# Patient Record
Sex: Female | Born: 1955
Health system: Southern US, Community
[De-identification: ages and names within clinical notes are randomized; demographics above are authoritative.]

## PROBLEM LIST (undated history)

## (undated) DIAGNOSIS — M1990 Unspecified osteoarthritis, unspecified site: Secondary | ICD-10-CM

## (undated) DIAGNOSIS — K219 Gastro-esophageal reflux disease without esophagitis: Secondary | ICD-10-CM

## (undated) DIAGNOSIS — H3530 Unspecified macular degeneration: Secondary | ICD-10-CM

## (undated) DIAGNOSIS — E785 Hyperlipidemia, unspecified: Secondary | ICD-10-CM

## (undated) DIAGNOSIS — L719 Rosacea, unspecified: Secondary | ICD-10-CM

## (undated) DIAGNOSIS — Z860100 Personal history of colon polyps, unspecified: Secondary | ICD-10-CM

## (undated) DIAGNOSIS — T7840XA Allergy, unspecified, initial encounter: Secondary | ICD-10-CM

## (undated) DIAGNOSIS — Z5189 Encounter for other specified aftercare: Secondary | ICD-10-CM

## (undated) HISTORY — PX: BREAST BIOPSY: SHX20

## (undated) HISTORY — PX: COSMETIC SURGERY: SHX468

## (undated) HISTORY — PX: BREAST SURGERY: SHX581

## (undated) HISTORY — PX: BREAST EXCISIONAL BIOPSY: SUR124

## (undated) HISTORY — DX: Unspecified osteoarthritis, unspecified site: M19.90

## (undated) HISTORY — DX: Personal history of colonic polyps: Z86.010

## (undated) HISTORY — DX: Personal history of colon polyps, unspecified: Z86.0100

## (undated) HISTORY — PX: AUGMENTATION MAMMAPLASTY: SUR837

## (undated) HISTORY — DX: Unspecified macular degeneration: H35.30

## (undated) HISTORY — PX: COLONOSCOPY: SHX174

## (undated) HISTORY — DX: Hyperlipidemia, unspecified: E78.5

## (undated) HISTORY — DX: Gastro-esophageal reflux disease without esophagitis: K21.9

## (undated) HISTORY — DX: Rosacea, unspecified: L71.9

## (undated) HISTORY — DX: Allergy, unspecified, initial encounter: T78.40XA

## (undated) HISTORY — DX: Encounter for other specified aftercare: Z51.89

## (undated) HISTORY — PX: TUBAL LIGATION: SHX77

---

## 1995-03-24 HISTORY — PX: ABDOMINAL HYSTERECTOMY: SHX81

## 2007-03-24 HISTORY — PX: AUGMENTATION MAMMAPLASTY: SUR837

## 2018-06-01 ENCOUNTER — Other Ambulatory Visit: Payer: Self-pay

## 2018-06-01 ENCOUNTER — Encounter: Payer: Self-pay | Admitting: Family Medicine

## 2018-06-01 ENCOUNTER — Ambulatory Visit (INDEPENDENT_AMBULATORY_CARE_PROVIDER_SITE_OTHER): Payer: 59 | Admitting: Family Medicine

## 2018-06-01 VITALS — BP 110/72 | HR 72 | Temp 98.1°F | Ht 64.5 in | Wt 132.1 lb

## 2018-06-01 DIAGNOSIS — Z1159 Encounter for screening for other viral diseases: Secondary | ICD-10-CM

## 2018-06-01 DIAGNOSIS — Z114 Encounter for screening for human immunodeficiency virus [HIV]: Secondary | ICD-10-CM

## 2018-06-01 DIAGNOSIS — Z23 Encounter for immunization: Secondary | ICD-10-CM | POA: Diagnosis not present

## 2018-06-01 DIAGNOSIS — L719 Rosacea, unspecified: Secondary | ICD-10-CM | POA: Diagnosis not present

## 2018-06-01 DIAGNOSIS — H353190 Nonexudative age-related macular degeneration, unspecified eye, stage unspecified: Secondary | ICD-10-CM

## 2018-06-01 DIAGNOSIS — H3530 Unspecified macular degeneration: Secondary | ICD-10-CM | POA: Insufficient documentation

## 2018-06-01 DIAGNOSIS — Z0000 Encounter for general adult medical examination without abnormal findings: Secondary | ICD-10-CM | POA: Diagnosis not present

## 2018-06-01 NOTE — Addendum Note (Signed)
Addended by: Kem Boroughs D on: 06/01/2018 10:57 AM   Modules accepted: Orders

## 2018-06-01 NOTE — Addendum Note (Signed)
Addended by: Elta Guadeloupe on: 06/01/2018 10:44 AM   Modules accepted: Orders

## 2018-06-01 NOTE — Progress Notes (Signed)
Chief Complaint  Patient presents with  . New Patient (Initial Visit)     Well Woman Sarah Flowers is here for a complete physical.   Her last physical was >1 year ago.  Current diet: in general, a "healthy" diet. Current exercise: yoga, running, walking.  Weight is stable and she denies daytime fatigue. No LMP recorded.  Seatbelt? Yes  Health Maintenance Pap/HPV- no cervix DEXA- 12.19 Mammogram- Yes Tetanus- No Hep C screening- No HIV screening- No  Past Medical History:  Diagnosis Date  . Macular degeneration   . Rosacea      Past Surgical History:  Procedure Laterality Date  . ABDOMINAL HYSTERECTOMY  1997  . BREAST SURGERY    . CESAREAN SECTION      Medications  Current Outpatient Medications on File Prior to Visit  Medication Sig Dispense Refill  . Estradiol (IMVEXXY MAINTENANCE PACK) 4 MCG INST Place vaginally.     Allergies Allergies  Allergen Reactions  . Sulfa Antibiotics Other (See Comments)    Mouth sores    Review of Systems: Constitutional:  no unexpected weight changes Eye:  +macular degeneration Ear/Nose/Mouth/Throat:  Ears:  no tinnitus or vertigo and no recent change in hearing Nose/Mouth/Throat:  no complaints of nasal congestion, no sore throat Cardiovascular: no chest pain Respiratory:  no cough and no shortness of breath Gastrointestinal:  no abdominal pain, no change in bowel habits GU:  Female: negative for dysuria or pelvic pain Musculoskeletal/Extremities:  no pain of the joints Integumentary (Skin/Breast): +roasacea; otherwise no abnormal skin lesions reported Neurologic:  no headaches Endocrine:  denies fatigue Hematologic/Lymphatic:  No areas of easy bleeding  Exam BP 110/72 (BP Location: Left Arm, Patient Position: Sitting, Cuff Size: Normal)   Pulse 72   Temp 98.1 F (36.7 C) (Oral)   Ht 5' 4.5" (1.638 m)   Wt 132 lb 2 oz (59.9 kg)   SpO2 98%   BMI 22.33 kg/m  General:  well developed, well nourished, in no  apparent distress Skin: small patch of telangiectasias on L cheek no significant moles, warts, or growths Head:  no masses, lesions, or tenderness Eyes:  pupils equal and round, sclera anicteric without injection Ears:  canals without lesions, TMs shiny without retraction, no obvious effusion, no erythema Nose:  nares patent, septum midline, mucosa normal, and no drainage or sinus tenderness Throat/Pharynx:  lips and gingiva without lesion; tongue and uvula midline; non-inflamed pharynx; no exudates or postnasal drainage Neck: neck supple without adenopathy, thyromegaly, or masses Lungs:  clear to auscultation, breath sounds equal bilaterally, no respiratory distress Cardio:  regular rate and rhythm, no bruits, no LE edema Abdomen:  abdomen soft, nontender; bowel sounds normal; no masses or organomegaly Genital: Defer to GYN Musculoskeletal:  symmetrical muscle groups noted without atrophy or deformity Extremities:  no clubbing, cyanosis, or edema, no deformities, no skin discoloration Neuro:  gait normal; deep tendon reflexes normal and symmetric Psych: well oriented with normal range of affect and appropriate judgment/insight  Assessment and Plan  Well adult exam - Plan: Comprehensive metabolic panel, Lipid panel  Screening for HIV (human immunodeficiency virus) - Plan: HIV Antibody (routine testing w rflx)  Encounter for hepatitis C screening test for low risk patient - Plan: Hepatitis C antibody  Rosacea - Plan: Ambulatory referral to Dermatology  Nonexudative age-related macular degeneration, unspecified laterality, unspecified stage   Well 63 y.o. female. Counseled on diet and exercise. Wt lifting rec'd. Doing well overall.  For mac degen, she is going ot find  a retinal specialist and let us know if she needs referral.  Lab orders for [facility] given.  Other orders as above. Follow up in 1 yr or prn, welcome to Medicare visit. The patient voiced understanding and agreement to the  plan.  [place], DO 06/01/18 10:13 AM

## 2018-06-01 NOTE — Addendum Note (Signed)
Addended by: Sharon Seller B on: 06/01/2018 10:23 AM   Modules accepted: Orders

## 2018-06-01 NOTE — Patient Instructions (Signed)
If you do not hear anything about your referral in the next 1-2 weeks, call our office and ask for an update.  Give [place] 2-3 business days to get the results of your labs back.   Keep the diet clean and stay active. Consider weight resistance exercising.   Let us know if you need anything.

## 2018-06-02 LAB — COMPREHENSIVE METABOLIC PANEL
AG Ratio: 2.4 (calc) (ref 1.0–2.5)
ALT: 19 U/L (ref 6–29)
AST: 23 U/L (ref 10–35)
Albumin: 4.5 g/dL (ref 3.6–5.1)
Alkaline phosphatase (APISO): 58 U/L (ref 37–153)
BUN: 10 mg/dL (ref 7–25)
CO2: 28 mmol/L (ref 20–32)
Calcium: 9.9 mg/dL (ref 8.6–10.4)
Chloride: 103 mmol/L (ref 98–110)
Creat: 0.84 mg/dL (ref 0.50–0.99)
Globulin: 1.9 g/dL (calc) (ref 1.9–3.7)
Glucose, Bld: 100 mg/dL (ref 65–139)
Potassium: 4.3 mmol/L (ref 3.5–5.3)
Sodium: 139 mmol/L (ref 135–146)
Total Bilirubin: 0.6 mg/dL (ref 0.2–1.2)
Total Protein: 6.4 g/dL (ref 6.1–8.1)

## 2018-06-02 LAB — HEPATITIS C ANTIBODY
Hepatitis C Ab: NONREACTIVE
SIGNAL TO CUT-OFF: 0.01 (ref <1.00)

## 2018-06-02 LAB — HIV ANTIBODY (ROUTINE TESTING W REFLEX): HIV 1&2 Ab, 4th Generation: NONREACTIVE

## 2018-06-02 LAB — LIPID PANEL
Cholesterol: 189 mg/dL (ref <200)
HDL: 79 mg/dL (ref >=50)
LDL CHOLESTEROL (CALC): 91 mg/dL
Non-HDL Cholesterol (Calc): 110 mg/dL (calc) (ref <130)
Total CHOL/HDL Ratio: 2.4 (calc) (ref <5.0)
Triglycerides: 98 mg/dL (ref <150)

## 2018-08-29 DIAGNOSIS — H353131 Nonexudative age-related macular degeneration, bilateral, early dry stage: Secondary | ICD-10-CM | POA: Diagnosis not present

## 2018-08-29 DIAGNOSIS — H2513 Age-related nuclear cataract, bilateral: Secondary | ICD-10-CM | POA: Diagnosis not present

## 2018-08-29 DIAGNOSIS — H43813 Vitreous degeneration, bilateral: Secondary | ICD-10-CM | POA: Diagnosis not present

## 2018-09-07 DIAGNOSIS — L719 Rosacea, unspecified: Secondary | ICD-10-CM | POA: Diagnosis not present

## 2018-12-08 DIAGNOSIS — Z23 Encounter for immunization: Secondary | ICD-10-CM | POA: Diagnosis not present

## 2018-12-08 DIAGNOSIS — L719 Rosacea, unspecified: Secondary | ICD-10-CM | POA: Diagnosis not present

## 2018-12-08 DIAGNOSIS — I781 Nevus, non-neoplastic: Secondary | ICD-10-CM | POA: Diagnosis not present

## 2019-01-24 ENCOUNTER — Other Ambulatory Visit (HOSPITAL_BASED_OUTPATIENT_CLINIC_OR_DEPARTMENT_OTHER): Payer: Self-pay | Admitting: Advanced Practice Midwife

## 2019-01-24 ENCOUNTER — Other Ambulatory Visit: Payer: Self-pay

## 2019-01-24 ENCOUNTER — Ambulatory Visit (INDEPENDENT_AMBULATORY_CARE_PROVIDER_SITE_OTHER): Payer: Medicare Other | Admitting: Advanced Practice Midwife

## 2019-01-24 ENCOUNTER — Encounter: Payer: Self-pay | Admitting: Advanced Practice Midwife

## 2019-01-24 ENCOUNTER — Other Ambulatory Visit (HOSPITAL_BASED_OUTPATIENT_CLINIC_OR_DEPARTMENT_OTHER): Payer: Self-pay | Admitting: Family Medicine

## 2019-01-24 VITALS — BP 134/82 | HR 79 | Ht 64.5 in | Wt 126.0 lb

## 2019-01-24 DIAGNOSIS — Z90710 Acquired absence of both cervix and uterus: Secondary | ICD-10-CM

## 2019-01-24 DIAGNOSIS — Z01419 Encounter for gynecological examination (general) (routine) without abnormal findings: Secondary | ICD-10-CM

## 2019-01-24 DIAGNOSIS — Z1231 Encounter for screening mammogram for malignant neoplasm of breast: Secondary | ICD-10-CM

## 2019-01-24 DIAGNOSIS — E8940 Asymptomatic postprocedural ovarian failure: Secondary | ICD-10-CM | POA: Insufficient documentation

## 2019-01-24 DIAGNOSIS — N952 Postmenopausal atrophic vaginitis: Secondary | ICD-10-CM | POA: Insufficient documentation

## 2019-01-24 DIAGNOSIS — N951 Menopausal and female climacteric states: Secondary | ICD-10-CM | POA: Insufficient documentation

## 2019-01-24 DIAGNOSIS — N898 Other specified noninflammatory disorders of vagina: Secondary | ICD-10-CM

## 2019-01-24 NOTE — Progress Notes (Signed)
Postmenopausal - hysterectomy done in 1997 but patient reports she does have her ovaries. Kathrene Alu RN

## 2019-01-24 NOTE — Patient Instructions (Signed)
Atrophic Vaginitis  Atrophic vaginitis is a condition in which the tissues that line the vagina become dry and thin. This condition is most common in women who have stopped having regular menstrual periods (are in menopause). This usually starts when a woman is 45-65 years old. That is the time when a woman's estrogen levels begin to drop (decrease). Estrogen is a female hormone. It helps to keep the tissues of the vagina moist. It stimulates the vagina to produce a clear fluid that lubricates the vagina for sexual intercourse. This fluid also protects the vagina from infection. Lack of estrogen can cause the lining of the vagina to get thinner and dryer. The vagina may also shrink in size. It may become less elastic. Atrophic vaginitis tends to get worse over time as a woman's estrogen level drops. What are the causes? This condition is caused by the normal drop in estrogen that happens around the time of menopause. What increases the risk? Certain conditions or situations may lower a woman's estrogen level, leading to a higher risk for atrophic vaginitis. You are more likely to develop this condition if:  You are taking medicines that block estrogen.  You have had your ovaries removed.  You are being treated for cancer with X-ray (radiation) or medicines (chemotherapy).  You have given birth or are breastfeeding.  You are older than age 50.  You smoke. What are the signs or symptoms? Symptoms of this condition include:  Pain, soreness, or bleeding during sexual intercourse (dyspareunia).  Vaginal burning, irritation, or itching.  Pain or bleeding when a speculum is used in a vaginal exam (pelvic exam).  Having burning pain when passing urine.  Vaginal discharge that is brown or yellow. In some cases, there are no symptoms. How is this diagnosed? This condition is diagnosed by taking a medical history and doing a physical exam. This will include a pelvic exam that checks the  vaginal tissues. Though rare, you may also have other tests, including:  A urine test.  A test that checks the acid balance in your vagina (acid balance test). How is this treated? Treatment for this condition depends on how severe your symptoms are. Treatment may include:  Using an over-the-counter vaginal lubricant before sex.  Using a long-acting vaginal moisturizer.  Using low-dose vaginal estrogen for moderate to severe symptoms that do not respond to other treatments. Options include creams, tablets, and inserts (vaginal rings). Before you use a vaginal estrogen, tell your health care provider if you have a history of: ? Breast cancer. ? Endometrial cancer. ? Blood clots. If you are not sexually active and your symptoms are very mild, you may not need treatment. Follow these instructions at home: Medicines  Take over-the-counter and prescription medicines only as told by your health care provider. Do not use herbal or alternative medicines unless your health care provider says that you can.  Use over-the-counter creams, lubricants, or moisturizers for dryness only as directed by your health care provider. General instructions  If your atrophic vaginitis is caused by menopause, discuss all of your menopause symptoms and treatment options with your health care provider.  Do not douche.  Do not use products that can make your vagina dry. These include: ? Scented feminine sprays. ? Scented tampons. ? Scented soaps.  Vaginal intercourse can help to improve blood flow and elasticity of vaginal tissue. If it hurts to have sex, try using a lubricant or moisturizer just before having intercourse. Contact a health care provider if:    Your discharge looks different than normal.  Your vagina has an unusual smell.  You have new symptoms.  Your symptoms do not improve with treatment.  Your symptoms get worse. Summary  Atrophic vaginitis is a condition in which the tissues that  line the vagina become dry and thin. It is most common in women who have stopped having regular menstrual periods (are in menopause).  Treatment options include using vaginal lubricants and low-dose vaginal estrogen.  Contact a health care provider if your vagina has an unusual smell, or if your symptoms get worse or do not improve after treatment. This information is not intended to replace advice given to you by your health care provider. Make sure you discuss any questions you have with your health care provider. Document Released: 07/24/2014 Document Revised: 02/19/2017 Document Reviewed: 12/03/2016 Elsevier Patient Education  2020 [employer] Maintenance After Age 85 After age 36, you are at a higher risk for certain long-term diseases and infections as well as injuries from falls. Falls are a major cause of broken bones and head injuries in people who are older than age 51. Getting regular preventive care can help to keep you healthy and well. Preventive care includes getting regular testing and making lifestyle changes as recommended by your health care provider. Talk with your health care provider about:  Which screenings and tests you should have. A screening is a test that checks for a disease when you have no symptoms.  A diet and exercise plan that is right for you. What should I know about screenings and tests to prevent falls? Screening and testing are the best ways to find a health problem early. Early diagnosis and treatment give you the best chance of managing medical conditions that are common after age 21. Certain conditions and lifestyle choices may make you more likely to have a fall. Your health care provider may recommend:  Regular vision checks. Poor vision and conditions such as cataracts can make you more likely to have a fall. If you wear glasses, make sure to get your prescription updated if your vision changes.  Medicine review. Work with your health care  provider to regularly review all of the medicines you are taking, including over-the-counter medicines. Ask your health care provider about any side effects that may make you more likely to have a fall. Tell your health care provider if any medicines that you take make you feel dizzy or sleepy.  Osteoporosis screening. Osteoporosis is a condition that causes the bones to get weaker. This can make the bones weak and cause them to break more easily.  Blood pressure screening. Blood pressure changes and medicines to control blood pressure can make you feel dizzy.  Strength and balance checks. Your health care provider may recommend certain tests to check your strength and balance while standing, walking, or changing positions.  Foot health exam. Foot pain and numbness, as well as not wearing proper footwear, can make you more likely to have a fall.  Depression screening. You may be more likely to have a fall if you have a fear of falling, feel emotionally low, or feel unable to do activities that you used to do.  Alcohol use screening. Using too much alcohol can affect your balance and may make you more likely to have a fall. What actions can I take to lower my risk of falls? General instructions  Talk with your health care provider about your risks for falling. Tell your health care provider if: ?  You fall. Be sure to tell your health care provider about all falls, even ones that seem minor. ? You feel dizzy, sleepy, or off-balance.  Take over-the-counter and prescription medicines only as told by your health care provider. These include any supplements.  Eat a healthy diet and maintain a healthy weight. A healthy diet includes low-fat dairy products, low-fat (lean) meats, and fiber from whole grains, beans, and lots of fruits and vegetables. Home safety  Remove any tripping hazards, such as rugs, cords, and clutter.  Install safety equipment such as grab bars in bathrooms and safety rails  on stairs.  Keep rooms and walkways well-lit. Activity   Follow a regular exercise program to stay fit. This will help you maintain your balance. Ask your health care provider what types of exercise are appropriate for you.  If you need a cane or walker, use it as recommended by your health care provider.  Wear supportive shoes that have nonskid soles. Lifestyle  Do not drink alcohol if your health care provider tells you not to drink.  If you drink alcohol, limit how much you have: ? 0-1 drink a day for women. ? 0-2 drinks a day for men.  Be aware of how much alcohol is in your drink. In the [place] one drink equals one typical bottle of beer (12 oz), one-half glass of wine (5 oz), or one shot of hard liquor (1 oz).  Do not use any products that contain nicotine or tobacco, such as cigarettes and e-cigarettes. If you need help quitting, ask your health care provider. Summary  Having a healthy lifestyle and getting preventive care can help to protect your health and wellness after age 37.  Screening and testing are the best way to find a health problem early and help you avoid having a fall. Early diagnosis and treatment give you the best chance for managing medical conditions that are more common for people who are older than age 51.  Falls are a major cause of broken bones and head injuries in people who are older than age 60. Take precautions to prevent a fall at home.  Work with your health care provider to learn what changes you can make to improve your health and wellness and to prevent falls. This information is not intended to replace advice given to you by your health care provider. Make sure you discuss any questions you have with your health care provider. Document Released: 01/20/2017 Document Revised: 06/30/2018 Document Reviewed: 01/20/2017 Elsevier Patient Education  2020 [employer].

## 2019-01-24 NOTE — Progress Notes (Signed)
GYNECOLOGY ANNUAL PREVENTATIVE CARE ENCOUNTER NOTE  Subjective:   Sarah Flowers is a 63 y.o. G23P2100 female here for a routine annual gynecologic exam.  Current complaints: Vaginal dryness despite estrogen cream use.   States was having good relief when she started the cream, but over time it does not work as well.  Did not like the Premarin cream due to inconvenience and messiness of it.   Not having any problems with hot flashes or other menopausal symptoms.   Denies abnormal vaginal bleeding, discharge, pelvic pain, problems with intercourse except vaginal irritation due to atrophy,  or other gynecologic concerns.   Has had annual physical exam with Dr Nani Ravens in March. Hx hysterectomy, still has ovaries.    Gynecologic History No LMP recorded. Patient is postmenopausal. Contraception: status post hysterectomy and menopausal Last Pap: can't remember Results were: normal Last mammogram: 2019. Results were: normal  Obstetric History OB History  Gravida Para Term Preterm AB Living  3 3 2 1       SAB TAB Ectopic Multiple Live Births          3    # Outcome Date GA Lbr Len/2nd Weight Sex Delivery Anes PTL Lv  3 Preterm  [redacted]w[redacted]d   F CS-LTranv        Complications: Abruptio Placenta  2 Term      Vag-Spont     1 Term      Vag-Spont       Past Medical History:  Diagnosis Date  . Macular degeneration   . Rosacea     Past Surgical History:  Procedure Laterality Date  . ABDOMINAL HYSTERECTOMY  1997  . BREAST SURGERY    . CESAREAN SECTION      Current Outpatient Medications on File Prior to Visit  Medication Sig Dispense Refill  . Bacillus Coagulans-Inulin (PROBIOTIC) 1-250 BILLION-MG CAPS Take by mouth.    . diphenhydrAMINE (BENADRYL) 25 MG tablet Take 25 mg by mouth every 6 (six) hours as needed.    . doxycycline (MONODOX) 100 MG capsule Take 100 mg by mouth 2 (two) times daily.    . Estradiol (IMVEXXY MAINTENANCE PACK) 4 MCG INST Place vaginally.    Marland Kitchen loratadine  (CLARITIN) 10 MG tablet Take 10 mg by mouth daily.    . Multiple Vitamins-Minerals (PRESERVISION AREDS 2+MULTI VIT PO) Take by mouth.     No current facility-administered medications on file prior to visit.     Allergies  Allergen Reactions  . Sulfa Antibiotics Other (See Comments)    Mouth sores    Social History   Socioeconomic History  . Marital status: Married    Spouse name: Not on file  . Number of children: Not on file  . Years of education: Not on file  . Highest education level: Not on file  Occupational History  . Not on file  Social Needs  . Financial resource strain: Not on file  . Food insecurity    Worry: Not on file    Inability: Not on file  . Transportation needs    Medical: Not on file    Non-medical: Not on file  Tobacco Use  . Smoking status: Never Smoker  . Smokeless tobacco: Never Used  Substance and Sexual Activity  . Alcohol use: Yes    Comment: occaional  . Drug use: Never  . Sexual activity: Yes  Lifestyle  . Physical activity    Days per week: Not on file    Minutes per session: Not on  file  . Stress: Not on file  Relationships  . Social Herbalist on phone: Not on file    Gets together: Not on file    Attends religious service: Not on file    Active member of club or organization: Not on file    Attends meetings of clubs or organizations: Not on file    Relationship status: Not on file  . Intimate partner violence    Fear of current or ex partner: Not on file    Emotionally abused: Not on file    Physically abused: Not on file    Forced sexual activity: Not on file  Other Topics Concern  . Not on file  Social History Narrative  . Not on file    Family History  Problem Relation Age of Onset  . Multiple sclerosis Mother   . Heart disease Father     The following portions of the patient's history were reviewed and updated as appropriate: allergies, current medications, past family history, past medical history,  past social history, past surgical history and problem list.  Review of Systems A comprehensive review of systems was negative except for: Genitourinary: positive for vaginal dryness and pain in vaginal tissue with intercourse No problems with diarrhea or constipation, no hot flashes   Objective:  BP 134/82   Pulse 79   Ht 5' 4.5" (1.638 m)   Wt 57.2 kg   BMI 21.29 kg/m  CONSTITUTIONAL: Well-developed, well-nourished female in no acute distress.  NECK: Normal range of motion, supple, no masses.  Normal thyroid.  SKIN: Skin is warm and dry. No rash noted. Not diaphoretic. No erythema. No pallor. NEUROLOGIC: Alert and oriented to person, place, and time. Normal reflexes, muscle tone coordination. No cranial nerve deficit noted. PSYCHIATRIC: Normal mood and affect. Normal behavior. Normal judgment and thought content. CARDIOVASCULAR: Normal heart rate noted, regular rhythm RESPIRATORY: Clear to auscultation bilaterally. Effort and breath sounds normal, no problems with respiration noted. BREASTS: Symmetric in size. No masses, skin changes, nipple drainage, or lymphadenopathy.   Exam consistent with implants bilaterally ABDOMEN: Soft, normal bowel sounds, no distention noted.  No tenderness, rebound or guarding.  PELVIC: External genitalia with atrophic changes to vulva and vagina, pink with loss of rugae.  There is a 1-19mm white, firm lesion at introitus at 7:00 position.  Appears like scar tissue but pt states has never noticed it.   No abnormal discharge noted.  Pap smear not obtained.  Uterus and cervix surgically absent.  no other palpable masses, No adnexal tenderness. MUSCULOSKELETAL: Normal range of motion. No tenderness.  No cyanosis, clubbing, or edema.     Assessment:  Annual gynecologic examination without pap smear Atrophic vaginal changes/dryness Vaginal/vulvar lesion   Plan:  Will consult Dr Ihor Dow re: options for treatment of atrophic vaginal irritation. Will  have her return next week for eval of vaginal/vulvar lesion, ?biopsy Mammogram scheduled Routine preventative health maintenance measures emphasized. Please refer to After Visit Summary for other counseling recommendations.

## 2019-02-01 ENCOUNTER — Other Ambulatory Visit: Payer: Self-pay

## 2019-02-01 ENCOUNTER — Ambulatory Visit (INDEPENDENT_AMBULATORY_CARE_PROVIDER_SITE_OTHER): Payer: Medicare Other | Admitting: Obstetrics & Gynecology

## 2019-02-01 ENCOUNTER — Encounter: Payer: Self-pay | Admitting: Obstetrics & Gynecology

## 2019-02-01 VITALS — BP 123/75 | HR 74 | Wt 126.0 lb

## 2019-02-01 DIAGNOSIS — N958 Other specified menopausal and perimenopausal disorders: Secondary | ICD-10-CM

## 2019-02-01 NOTE — Progress Notes (Signed)
History:  63 y.o. G3P2100 here today for Annual GYN exam. She denies problems. She is on bio identical hormones and wants to know whether she should cont this or change hormones. She cont to have hot flushes. She also reports vagina dryness.  She would also like to establish care.  She was seen by Hansel Feinstein who wanted her vulva examined to eval a possible lesion.    The following portions of the patient's history were reviewed and updated as appropriate: allergies, current medications, past family history, past medical history, past social history, past surgical history and problem list.  Review of Systems:  Pertinent items are noted in HPI.    Objective:  Physical Exam Blood pressure 123/75, pulse 74, weight 126 lb (57.2 kg).  CONSTITUTIONAL: Well-developed, well-nourished female in no acute distress.  HENT:  Normocephalic, atraumatic EYES: Conjunctivae and EOM are normal. No scleral icterus.  NECK: Normal range of motion SKIN: Skin is warm and dry. No rash noted. Not diaphoretic.No pallor. [place]: Alert and oriented to person, place, and time. Normal coordination.  Abd: Soft, nontender and nondistended Pelvic: Normal appearing external genitalia- some age related atrophy; normal appearing vaginal mucosa and cervix. No vulvar lesions noted. There is a healed scar from childbirth..  Normal discharge.  Small uterus, no other palpable masses, no uterine or adnexal tenderness   Assessment & Plan:  Genitourinary syndrome of menopause.   Coconut oil bid  Keep Imvexxy (topical EES) 2x week   Vulvar lesion  No lesion noted  F/u in 3 months or sooner prn  Total face-to-face time with patient was 20 min.  Greater than 50% was spent in counseling and coordination of care with the patient.   Ellison Rieth L. Harraway-Smith, M.D., Cherlynn June

## 2019-02-01 NOTE — Progress Notes (Signed)
Patient had well woman exam last with with CNM. Patient here for evaluation of PMP symptoms and atrophic vaginitis. Kathrene Alu RN

## 2019-02-01 NOTE — Patient Instructions (Signed)
Atrophic Vaginitis  Atrophic vaginitis is a condition in which the tissues that line the vagina become dry and thin. This condition is most common in women who have stopped having regular menstrual periods (are in menopause). This usually starts when a woman is 45-65 years old. That is the time when a woman's estrogen levels begin to drop (decrease). Estrogen is a female hormone. It helps to keep the tissues of the vagina moist. It stimulates the vagina to produce a clear fluid that lubricates the vagina for sexual intercourse. This fluid also protects the vagina from infection. Lack of estrogen can cause the lining of the vagina to get thinner and dryer. The vagina may also shrink in size. It may become less elastic. Atrophic vaginitis tends to get worse over time as a woman's estrogen level drops. What are the causes? This condition is caused by the normal drop in estrogen that happens around the time of menopause. What increases the risk? Certain conditions or situations may lower a woman's estrogen level, leading to a higher risk for atrophic vaginitis. You are more likely to develop this condition if:  You are taking medicines that block estrogen.  You have had your ovaries removed.  You are being treated for cancer with X-ray (radiation) or medicines (chemotherapy).  You have given birth or are breastfeeding.  You are older than age 50.  You smoke. What are the signs or symptoms? Symptoms of this condition include:  Pain, soreness, or bleeding during sexual intercourse (dyspareunia).  Vaginal burning, irritation, or itching.  Pain or bleeding when a speculum is used in a vaginal exam (pelvic exam).  Having burning pain when passing urine.  Vaginal discharge that is brown or yellow. In some cases, there are no symptoms. How is this diagnosed? This condition is diagnosed by taking a medical history and doing a physical exam. This will include a pelvic exam that checks the  vaginal tissues. Though rare, you may also have other tests, including:  A urine test.  A test that checks the acid balance in your vagina (acid balance test). How is this treated? Treatment for this condition depends on how severe your symptoms are. Treatment may include:  Using an over-the-counter vaginal lubricant before sex.  Using a long-acting vaginal moisturizer.  Using low-dose vaginal estrogen for moderate to severe symptoms that do not respond to other treatments. Options include creams, tablets, and inserts (vaginal rings). Before you use a vaginal estrogen, tell your health care provider if you have a history of: ? Breast cancer. ? Endometrial cancer. ? Blood clots. If you are not sexually active and your symptoms are very mild, you may not need treatment. Follow these instructions at home: Medicines  Take over-the-counter and prescription medicines only as told by your health care provider. Do not use herbal or alternative medicines unless your health care provider says that you can.  Use over-the-counter creams, lubricants, or moisturizers for dryness only as directed by your health care provider. General instructions  If your atrophic vaginitis is caused by menopause, discuss all of your menopause symptoms and treatment options with your health care provider.  Do not douche.  Do not use products that can make your vagina dry. These include: ? Scented feminine sprays. ? Scented tampons. ? Scented soaps.  Vaginal intercourse can help to improve blood flow and elasticity of vaginal tissue. If it hurts to have sex, try using a lubricant or moisturizer just before having intercourse. Contact a health care provider if:    Your discharge looks different than normal.  Your vagina has an unusual smell.  You have new symptoms.  Your symptoms do not improve with treatment.  Your symptoms get worse. Summary  Atrophic vaginitis is a condition in which the tissues that  line the vagina become dry and thin. It is most common in women who have stopped having regular menstrual periods (are in menopause).  Treatment options include using vaginal lubricants and low-dose vaginal estrogen.  Contact a health care provider if your vagina has an unusual smell, or if your symptoms get worse or do not improve after treatment. This information is not intended to replace advice given to you by your health care provider. Make sure you discuss any questions you have with your health care provider. Document Released: 07/24/2014 Document Revised: 02/19/2017 Document Reviewed: 12/03/2016 Elsevier Patient Education  2020 Elsevier Inc.  

## 2019-02-02 ENCOUNTER — Telehealth: Payer: Self-pay

## 2019-02-02 NOTE — Telephone Encounter (Signed)
Pt called the office requesting a Rx for Estadiol 10 mcg(Imvexxy Maintenance pack). Pt states she would like a 3 month supply. Pt made aware that message will be sent to provider. Nico Rogness l Areli Frary, CMA

## 2019-02-13 ENCOUNTER — Encounter: Payer: Self-pay | Admitting: Obstetrics & Gynecology

## 2019-02-13 MED ORDER — IMVEXXY MAINTENANCE PACK 4 MCG VA INST: 1.0000 | VAGINAL_INSERT | VAGINAL | 3 refills | Status: DC

## 2019-03-08 ENCOUNTER — Ambulatory Visit (HOSPITAL_BASED_OUTPATIENT_CLINIC_OR_DEPARTMENT_OTHER): Payer: Medicare Other

## 2019-03-13 ENCOUNTER — Encounter (HOSPITAL_BASED_OUTPATIENT_CLINIC_OR_DEPARTMENT_OTHER): Payer: Self-pay

## 2019-03-13 ENCOUNTER — Ambulatory Visit (HOSPITAL_BASED_OUTPATIENT_CLINIC_OR_DEPARTMENT_OTHER)
Admission: RE | Admit: 2019-03-13 | Discharge: 2019-03-13 | Disposition: A | Discharge status: Home / Self Care | Payer: Medicare Other | Source: Ambulatory Visit | Attending: Advanced Practice Midwife | Admitting: Advanced Practice Midwife

## 2019-03-13 ENCOUNTER — Other Ambulatory Visit: Payer: Self-pay

## 2019-03-13 DIAGNOSIS — Z1231 Encounter for screening mammogram for malignant neoplasm of breast: Secondary | ICD-10-CM | POA: Diagnosis not present

## 2019-04-11 ENCOUNTER — Other Ambulatory Visit: Payer: Medicare Other

## 2019-04-14 DIAGNOSIS — M9903 Segmental and somatic dysfunction of lumbar region: Secondary | ICD-10-CM | POA: Diagnosis not present

## 2019-04-14 DIAGNOSIS — M9901 Segmental and somatic dysfunction of cervical region: Secondary | ICD-10-CM | POA: Diagnosis not present

## 2019-04-14 DIAGNOSIS — M542 Cervicalgia: Secondary | ICD-10-CM | POA: Diagnosis not present

## 2019-04-14 DIAGNOSIS — M5442 Lumbago with sciatica, left side: Secondary | ICD-10-CM | POA: Diagnosis not present

## 2019-04-17 DIAGNOSIS — L570 Actinic keratosis: Secondary | ICD-10-CM | POA: Diagnosis not present

## 2019-04-21 DIAGNOSIS — M542 Cervicalgia: Secondary | ICD-10-CM | POA: Diagnosis not present

## 2019-04-21 DIAGNOSIS — M5442 Lumbago with sciatica, left side: Secondary | ICD-10-CM | POA: Diagnosis not present

## 2019-04-21 DIAGNOSIS — M9903 Segmental and somatic dysfunction of lumbar region: Secondary | ICD-10-CM | POA: Diagnosis not present

## 2019-04-21 DIAGNOSIS — M9901 Segmental and somatic dysfunction of cervical region: Secondary | ICD-10-CM | POA: Diagnosis not present

## 2019-04-22 ENCOUNTER — Ambulatory Visit: Payer: Medicare Other

## 2019-04-24 ENCOUNTER — Other Ambulatory Visit: Payer: Self-pay | Admitting: Obstetrics & Gynecology

## 2019-04-24 ENCOUNTER — Telehealth: Payer: Self-pay

## 2019-04-24 DIAGNOSIS — N952 Postmenopausal atrophic vaginitis: Secondary | ICD-10-CM

## 2019-04-24 MED ORDER — ESTRADIOL 0.1 MG/GM VA CREA: 1.0000 g | TOPICAL_CREAM | Freq: Every day | VAGINAL | 6 refills | Status: DC

## 2019-04-24 NOTE — Telephone Encounter (Signed)
-----   Message from Lavonia Drafts, MD sent at 04/24/2019  8:14 AM EST ----- Regarding: RE: Rx problem Please let pt know that I have sent a Rx for vaginal estridiol. She can use 1 gram nighly for 2 weeks followed by 3x/week.   Thx,  Clh-S  ----- Message ----- From: Valentina Lucks, CMA Sent: 04/17/2019   1:54 PM EST To: Lavonia Drafts, MD Subject: Rx problem                                     Imvexxy 4 mg Maintenance Pack is not covered by pt's insurance plan. Is there an alternative medication available?

## 2019-04-24 NOTE — Telephone Encounter (Signed)
Pt called the office back and states she is weaning herself off of the Imvexxy 4 mg and is not interested in using any vaginal creams. Pt states she will discuss with doctor at her appointment.  Catha Ontko l Mindy Gali, CMA

## 2019-04-24 NOTE — Telephone Encounter (Signed)
Called pt regarding medication.Left message for pt to call the office back. Shernita Rabinovich l Sandra Brents, CMA

## 2019-04-27 ENCOUNTER — Ambulatory Visit: Payer: Medicare Other

## 2019-04-27 ENCOUNTER — Ambulatory Visit: Payer: Medicare Other | Attending: Internal Medicine

## 2019-04-27 DIAGNOSIS — Z23 Encounter for immunization: Secondary | ICD-10-CM | POA: Insufficient documentation

## 2019-04-27 NOTE — Progress Notes (Signed)
   Covid-19 Vaccination Clinic  Name:  Sarah Flowers    MRN: [id number] DOB: November 12, 1955  04/27/2019  [name] was observed post Covid-19 immunization for 15 minutes without incidence. She was provided with Vaccine Information Sheet and instruction to access the V-Safe system.   [name] was instructed to call 911 with any severe reactions post vaccine: Marland Kitchen Difficulty breathing  . Swelling of your face and throat  . A fast heartbeat  . A bad rash all over your body  . Dizziness and weakness    Immunizations Administered    Name Date Dose VIS Date Route   Pfizer COVID-19 Vaccine 04/27/2019  9:11 AM 0.3 mL 03/03/2019 Intramuscular   Manufacturer: [place]   Lot: [id number]   [place]: [id number]

## 2019-04-28 DIAGNOSIS — M542 Cervicalgia: Secondary | ICD-10-CM | POA: Diagnosis not present

## 2019-04-28 DIAGNOSIS — M9903 Segmental and somatic dysfunction of lumbar region: Secondary | ICD-10-CM | POA: Diagnosis not present

## 2019-04-28 DIAGNOSIS — M5442 Lumbago with sciatica, left side: Secondary | ICD-10-CM | POA: Diagnosis not present

## 2019-04-28 DIAGNOSIS — M9901 Segmental and somatic dysfunction of cervical region: Secondary | ICD-10-CM | POA: Diagnosis not present

## 2019-05-10 ENCOUNTER — Ambulatory Visit (INDEPENDENT_AMBULATORY_CARE_PROVIDER_SITE_OTHER): Payer: Medicare Other | Admitting: Obstetrics & Gynecology

## 2019-05-10 ENCOUNTER — Other Ambulatory Visit: Payer: Self-pay

## 2019-05-10 ENCOUNTER — Encounter: Payer: Self-pay | Admitting: Obstetrics & Gynecology

## 2019-05-10 VITALS — BP 121/77 | HR 68 | Ht 64.5 in | Wt 131.1 lb

## 2019-05-10 DIAGNOSIS — N952 Postmenopausal atrophic vaginitis: Secondary | ICD-10-CM

## 2019-05-10 DIAGNOSIS — N9089 Other specified noninflammatory disorders of vulva and perineum: Secondary | ICD-10-CM

## 2019-05-10 NOTE — Patient Instructions (Signed)
Natural Vaginal moisturizer ingredients: Vit E Almond oil coconut oil Shea butter (or coco butter)  Blend together and apply a small amount to the vulva 1-2 times per day

## 2019-05-10 NOTE — Progress Notes (Signed)
History:  64 y.o. G3P2100 here today for f/u of possible vulvar lesion and vagianl atrophy. Pt denies changes to vulva. She decided against topical EES and want to cont natual ral ialutrula remedies. She has been using the coconut oil and has not noticed a difference or regression so she will cont to use this.  She thinks that the topical EES stopped being effective.    The following portions of the patient's history were reviewed and updated as appropriate: allergies, current medications, past family history, past medical history, past social history, past surgical history and problem list.  Review of Systems:  Pertinent items are noted in HPI.    Objective:  Physical Exam Blood pressure 121/77, pulse 68, height 5' 4.5" (1.638 m), weight 131 lb 1.3 oz (59.5 kg).  CONSTITUTIONAL: Well-developed, well-nourished female in no acute distress.  HENT:  Normocephalic, atraumatic EYES: Conjunctivae and EOM are normal. No scleral icterus.  NECK: Normal range of motion SKIN: Skin is warm and dry. No rash noted. Not diaphoretic.No pallor. [place]: Alert and oriented to person, place, and time. Normal coordination.  Pelvic: Normal appearing external genitalia- perv area appears to be a well healed scar from a previous episiotomy. There is no change from her previous visit.   Assessment & Plan:  Atrophic vaginitis- pt declined topical EES  Review natural options to consider in addition or in lieu of coconut oil  Natural Vaginal moisturizer ingredients:   Vit E   Almond oil   coconut oil   Shea butter (or coco butter)    Blend together and apply a small amount to the vulva 1-2 times per day   Vulvar lesion  Not concerning. Appears to be scar tissue.   Pt will f/u if any changes occur  Total face-to-face time with patient was 18 min.  Greater than 50% was spent in counseling and coordination of care with the patient.   Adler Alton L. Harraway-Smith, M.D., Cherlynn June

## 2019-05-12 DIAGNOSIS — M9901 Segmental and somatic dysfunction of cervical region: Secondary | ICD-10-CM | POA: Diagnosis not present

## 2019-05-12 DIAGNOSIS — M9903 Segmental and somatic dysfunction of lumbar region: Secondary | ICD-10-CM | POA: Diagnosis not present

## 2019-05-12 DIAGNOSIS — M5442 Lumbago with sciatica, left side: Secondary | ICD-10-CM | POA: Diagnosis not present

## 2019-05-12 DIAGNOSIS — M542 Cervicalgia: Secondary | ICD-10-CM | POA: Diagnosis not present

## 2019-05-22 ENCOUNTER — Ambulatory Visit: Payer: Medicare Other | Attending: Internal Medicine

## 2019-05-22 DIAGNOSIS — Z23 Encounter for immunization: Secondary | ICD-10-CM

## 2019-05-22 NOTE — Progress Notes (Signed)
   Covid-19 Vaccination Clinic  Name:  Sarah Flowers    MRN: [id number] DOB: 08-Nov-1955  05/22/2019  [name] was observed post Covid-19 immunization for 15 minutes without incidence. She was provided with Vaccine Information Sheet and instruction to access the V-Safe system.   [name] was instructed to call 911 with any severe reactions post vaccine: Marland Kitchen Difficulty breathing  . Swelling of your face and throat  . A fast heartbeat  . A bad rash all over your body  . Dizziness and weakness    Immunizations Administered    Name Date Dose VIS Date Route   Pfizer COVID-19 Vaccine 05/22/2019  9:40 AM 0.3 mL 03/03/2019 Intramuscular   Manufacturer: [place]   Lot: [id number]   [place]: [id number]

## 2019-05-26 DIAGNOSIS — M9903 Segmental and somatic dysfunction of lumbar region: Secondary | ICD-10-CM | POA: Diagnosis not present

## 2019-05-26 DIAGNOSIS — M5442 Lumbago with sciatica, left side: Secondary | ICD-10-CM | POA: Diagnosis not present

## 2019-05-26 DIAGNOSIS — M9901 Segmental and somatic dysfunction of cervical region: Secondary | ICD-10-CM | POA: Diagnosis not present

## 2019-05-26 DIAGNOSIS — M542 Cervicalgia: Secondary | ICD-10-CM | POA: Diagnosis not present

## 2019-06-07 ENCOUNTER — Ambulatory Visit: Payer: Medicare Other | Admitting: Family Medicine

## 2019-08-23 ENCOUNTER — Ambulatory Visit: Payer: 59 | Admitting: Family Medicine

## 2019-08-31 DIAGNOSIS — H43813 Vitreous degeneration, bilateral: Secondary | ICD-10-CM | POA: Diagnosis not present

## 2019-08-31 DIAGNOSIS — H353131 Nonexudative age-related macular degeneration, bilateral, early dry stage: Secondary | ICD-10-CM | POA: Diagnosis not present

## 2019-08-31 DIAGNOSIS — H2513 Age-related nuclear cataract, bilateral: Secondary | ICD-10-CM | POA: Diagnosis not present

## 2019-09-11 ENCOUNTER — Encounter: Payer: Self-pay | Admitting: Family Medicine

## 2019-09-11 ENCOUNTER — Ambulatory Visit (INDEPENDENT_AMBULATORY_CARE_PROVIDER_SITE_OTHER): Payer: Medicare Other | Admitting: Family Medicine

## 2019-09-11 ENCOUNTER — Other Ambulatory Visit: Payer: Self-pay

## 2019-09-11 VITALS — BP 110/78 | HR 74 | Temp 98.7°F | Resp 12 | Ht 64.5 in | Wt 126.8 lb

## 2019-09-11 DIAGNOSIS — Z0000 Encounter for general adult medical examination without abnormal findings: Secondary | ICD-10-CM

## 2019-09-11 DIAGNOSIS — Z136 Encounter for screening for cardiovascular disorders: Secondary | ICD-10-CM | POA: Diagnosis not present

## 2019-09-11 DIAGNOSIS — E2839 Other primary ovarian failure: Secondary | ICD-10-CM

## 2019-09-11 LAB — LIPID PANEL
Cholesterol: 228 mg/dL — ABNORMAL HIGH (ref 0–200)
HDL: 99.1 mg/dL (ref >39.00)
LDL Cholesterol: 113 mg/dL — ABNORMAL HIGH (ref 0–99)
NonHDL: 128.43
Total CHOL/HDL Ratio: 2
Triglycerides: 79 mg/dL (ref 0.0–149.0)
VLDL: 15.8 mg/dL (ref 0.0–40.0)

## 2019-09-11 LAB — COMPREHENSIVE METABOLIC PANEL
ALT: 27 U/L (ref 0–35)
AST: 32 U/L (ref 0–37)
Albumin: 4.8 g/dL (ref 3.5–5.2)
Alkaline Phosphatase: 67 U/L (ref 39–117)
BUN: 17 mg/dL (ref 6–23)
CO2: 27 mEq/L (ref 19–32)
Calcium: 10.3 mg/dL (ref 8.4–10.5)
Chloride: 102 mEq/L (ref 96–112)
Creatinine, Ser: 0.92 mg/dL (ref 0.40–1.20)
GFR: 61.08 mL/min (ref >60.00)
Glucose, Bld: 90 mg/dL (ref 70–99)
Potassium: 4.2 mEq/L (ref 3.5–5.1)
Sodium: 137 mEq/L (ref 135–145)
Total Bilirubin: 0.6 mg/dL (ref 0.2–1.2)
Total Protein: 6.9 g/dL (ref 6.0–8.3)

## 2019-09-11 LAB — HEMOGLOBIN A1C: Hgb A1c MFr Bld: 5.6 % (ref 4.6–6.5)

## 2019-09-11 NOTE — Patient Instructions (Signed)
Give [place] 2-3 business days to get the results of your labs back.   Send me a message around 1 week before you get back regarding your R hand and we will set you up with occupational therapy.  Your PCV23 (pneumococcal vaccine) would be due 8/26 of this year.   Keep the diet clean and stay active.  Let us know if you need anything.

## 2019-09-11 NOTE — Progress Notes (Signed)
Ts

## 2019-09-11 NOTE — Progress Notes (Signed)
Chief Complaint  Patient presents with  . Medicare Wellness    Subjective: Pt here for initial Welcome to Medicare Evaluation.  Vision Screen:   Hearing Screening   125Hz  250Hz  500Hz  1000Hz  2000Hz  3000Hz  4000Hz  6000Hz  8000Hz   Right ear:   Pass Pass Pass  Pass    Left ear:   Pass Pass Pass  Pass      Visual Acuity Screening   Right eye Left eye Both eyes  Without correction:     With correction: 20/20 20/20 20/20     Past Medical History:  Diagnosis Date  . Macular degeneration   . Rosacea    Family History  Problem Relation Age of Onset  . Multiple sclerosis Mother   . Heart disease Father    Past Surgical History:  Procedure Laterality Date  . ABDOMINAL HYSTERECTOMY  1997  . AUGMENTATION MAMMAPLASTY    . BREAST BIOPSY Bilateral    multiple, all benign   . BREAST SURGERY    . CESAREAN SECTION     Current Outpatient Medications on File Prior to Visit  Medication Sig Dispense Refill  . Bacillus Coagulans-Inulin (PROBIOTIC) 1-250 BILLION-MG CAPS Take by mouth daily.     . diphenhydrAMINE (BENADRYL) 25 MG tablet Take 25 mg by mouth every 6 (six) hours as needed.    . loratadine (CLARITIN) 10 MG tablet Take 10 mg by mouth daily.    . Multiple Vitamins-Minerals (PRESERVISION AREDS 2+MULTI VIT PO) Take by mouth.    . vitamin E (VITAMIN E) 200 UNIT capsule Take 200 Units by mouth daily.     Allergies  Allergen Reactions  . Sulfa Antibiotics Other (See Comments)    Mouth sores    Females: Smoking, alcohol/drugs, sunscreen  Mental Health/Substance abuse evaluation: Yes    PHQ-2:  Feelings of depression? No  Loss of satisfaction/pleasure in doing things? No   Fall Risk: Less than 2 falls within the past 12 months? Yes  Mindful of grabbing bars in bathroom, ruffles in rugs, poorly lit areas, handrails on the stairs? Yes   Discussion of functional ability done: Encouraged to maintain physical activity and flexibility. Live alone? No  Need help with the  following? Bathing: No  Managing money: No  Taking medications: No  Telephone use: No  Transportation: No  Shopping: No  Preparing meals: No   Hearing/Vision screen: Trouble hearing TV or radio when others do not? No  Straining or struggling to hear/understand conversation? No   Fire safety: Have a working smoke alarm? Yes   Diet Balanced diet? Yes  Assistance needed? No   BP 110/78 (BP Location: Right Arm, Cuff Size: Normal)   Pulse 74   Temp 98.7 F (37.1 C) (Temporal)   Resp 12   Ht 5' 4.5" (1.638 m)   Wt 126 lb 12.8 oz (57.5 kg)   SpO2 96%   BMI 21.43 kg/m   End of life care planning/counseling: Does patient wish to discuss end of life care/planning? Yes  Does the patient have an advanced directive? No   Patient code status/living will: Full code Forms given? Yes   Assessment and Plan Welcome to Medicare preventive visit  Colonoscopy/Mammogram/Pelvic exam- Written plan in AVS for preventative health services. Immunizations UTD Hand pain/deviation, will have her send message at end of summer when she returns to area and we can set up OT. Unilateral, doubt inflammatory arthropathy, she is not having pain either.  F/u in 1 year or prn. The patient voiced understanding and agreement to the  plan.  Shelda Pal, DO 09/11/19 12:58 PM

## 2019-09-15 ENCOUNTER — Other Ambulatory Visit: Payer: Self-pay

## 2019-09-15 ENCOUNTER — Ambulatory Visit (HOSPITAL_BASED_OUTPATIENT_CLINIC_OR_DEPARTMENT_OTHER)
Admission: RE | Admit: 2019-09-15 | Discharge: 2019-09-15 | Disposition: A | Discharge status: Home / Self Care | Payer: Medicare Other | Source: Ambulatory Visit | Attending: Family Medicine | Admitting: Family Medicine

## 2019-09-15 DIAGNOSIS — E2839 Other primary ovarian failure: Secondary | ICD-10-CM | POA: Diagnosis present

## 2019-09-15 DIAGNOSIS — M8588 Other specified disorders of bone density and structure, other site: Secondary | ICD-10-CM | POA: Diagnosis not present

## 2019-09-15 DIAGNOSIS — Z136 Encounter for screening for cardiovascular disorders: Secondary | ICD-10-CM

## 2019-09-15 DIAGNOSIS — Z13820 Encounter for screening for osteoporosis: Secondary | ICD-10-CM | POA: Diagnosis not present

## 2019-09-15 DIAGNOSIS — M8589 Other specified disorders of bone density and structure, multiple sites: Secondary | ICD-10-CM | POA: Diagnosis not present

## 2019-11-09 ENCOUNTER — Telehealth: Payer: Self-pay | Admitting: Family Medicine

## 2019-11-09 NOTE — Telephone Encounter (Signed)
Caller: Joanmarie Call back #  [phone]  Patient states she took a covid home test an is positive. She is wondering what she need to do next?

## 2019-11-10 NOTE — Telephone Encounter (Signed)
Notified patient but she stated she is out of town and it was a positive covid antigen test and she has come cold symptoms but no fever or chills.

## 2019-11-10 NOTE — Telephone Encounter (Signed)
Quarantine for the next 10 days, OK to return to public if fever free for 24 hrs and improving/no symptoms. Push fluids, look out for worsening symptoms and if so, seek care. Ty.

## 2019-11-10 NOTE — Telephone Encounter (Signed)
Patient called checking the status of mychart to provider in regards to [place] 19+.  Patient would like to know what to do.

## 2019-12-14 DIAGNOSIS — M546 Pain in thoracic spine: Secondary | ICD-10-CM | POA: Diagnosis not present

## 2019-12-14 DIAGNOSIS — M5413 Radiculopathy, cervicothoracic region: Secondary | ICD-10-CM | POA: Diagnosis not present

## 2019-12-14 DIAGNOSIS — M62830 Muscle spasm of back: Secondary | ICD-10-CM | POA: Diagnosis not present

## 2019-12-14 DIAGNOSIS — M9901 Segmental and somatic dysfunction of cervical region: Secondary | ICD-10-CM | POA: Diagnosis not present

## 2019-12-14 DIAGNOSIS — M9902 Segmental and somatic dysfunction of thoracic region: Secondary | ICD-10-CM | POA: Diagnosis not present

## 2019-12-20 DIAGNOSIS — M9901 Segmental and somatic dysfunction of cervical region: Secondary | ICD-10-CM | POA: Diagnosis not present

## 2019-12-20 DIAGNOSIS — M62830 Muscle spasm of back: Secondary | ICD-10-CM | POA: Diagnosis not present

## 2019-12-20 DIAGNOSIS — M5413 Radiculopathy, cervicothoracic region: Secondary | ICD-10-CM | POA: Diagnosis not present

## 2019-12-20 DIAGNOSIS — M9902 Segmental and somatic dysfunction of thoracic region: Secondary | ICD-10-CM | POA: Diagnosis not present

## 2019-12-20 DIAGNOSIS — M546 Pain in thoracic spine: Secondary | ICD-10-CM | POA: Diagnosis not present

## 2019-12-22 DIAGNOSIS — M9901 Segmental and somatic dysfunction of cervical region: Secondary | ICD-10-CM | POA: Diagnosis not present

## 2019-12-22 DIAGNOSIS — M9902 Segmental and somatic dysfunction of thoracic region: Secondary | ICD-10-CM | POA: Diagnosis not present

## 2019-12-22 DIAGNOSIS — M62830 Muscle spasm of back: Secondary | ICD-10-CM | POA: Diagnosis not present

## 2019-12-22 DIAGNOSIS — M5413 Radiculopathy, cervicothoracic region: Secondary | ICD-10-CM | POA: Diagnosis not present

## 2019-12-22 DIAGNOSIS — M546 Pain in thoracic spine: Secondary | ICD-10-CM | POA: Diagnosis not present

## 2020-01-12 DIAGNOSIS — M5413 Radiculopathy, cervicothoracic region: Secondary | ICD-10-CM | POA: Diagnosis not present

## 2020-01-12 DIAGNOSIS — M9902 Segmental and somatic dysfunction of thoracic region: Secondary | ICD-10-CM | POA: Diagnosis not present

## 2020-01-12 DIAGNOSIS — M9901 Segmental and somatic dysfunction of cervical region: Secondary | ICD-10-CM | POA: Diagnosis not present

## 2020-01-12 DIAGNOSIS — M62830 Muscle spasm of back: Secondary | ICD-10-CM | POA: Diagnosis not present

## 2020-01-12 DIAGNOSIS — M546 Pain in thoracic spine: Secondary | ICD-10-CM | POA: Diagnosis not present

## 2020-01-23 DIAGNOSIS — M9901 Segmental and somatic dysfunction of cervical region: Secondary | ICD-10-CM | POA: Diagnosis not present

## 2020-01-23 DIAGNOSIS — M546 Pain in thoracic spine: Secondary | ICD-10-CM | POA: Diagnosis not present

## 2020-01-23 DIAGNOSIS — M9902 Segmental and somatic dysfunction of thoracic region: Secondary | ICD-10-CM | POA: Diagnosis not present

## 2020-01-23 DIAGNOSIS — M5413 Radiculopathy, cervicothoracic region: Secondary | ICD-10-CM | POA: Diagnosis not present

## 2020-01-23 DIAGNOSIS — M62830 Muscle spasm of back: Secondary | ICD-10-CM | POA: Diagnosis not present

## 2020-02-20 DIAGNOSIS — H02814 Retained foreign body in left upper eyelid: Secondary | ICD-10-CM | POA: Diagnosis not present

## 2020-02-21 DEATH — deceased

## 2020-03-08 DIAGNOSIS — M5413 Radiculopathy, cervicothoracic region: Secondary | ICD-10-CM | POA: Diagnosis not present

## 2020-03-08 DIAGNOSIS — M9902 Segmental and somatic dysfunction of thoracic region: Secondary | ICD-10-CM | POA: Diagnosis not present

## 2020-03-08 DIAGNOSIS — M546 Pain in thoracic spine: Secondary | ICD-10-CM | POA: Diagnosis not present

## 2020-03-08 DIAGNOSIS — M62830 Muscle spasm of back: Secondary | ICD-10-CM | POA: Diagnosis not present

## 2020-03-08 DIAGNOSIS — M9901 Segmental and somatic dysfunction of cervical region: Secondary | ICD-10-CM | POA: Diagnosis not present

## 2020-04-03 DIAGNOSIS — M25511 Pain in right shoulder: Secondary | ICD-10-CM | POA: Diagnosis not present

## 2020-04-12 ENCOUNTER — Other Ambulatory Visit (HOSPITAL_BASED_OUTPATIENT_CLINIC_OR_DEPARTMENT_OTHER): Payer: Self-pay | Admitting: Family Medicine

## 2020-04-12 DIAGNOSIS — Z1231 Encounter for screening mammogram for malignant neoplasm of breast: Secondary | ICD-10-CM

## 2020-04-15 ENCOUNTER — Other Ambulatory Visit: Payer: Self-pay

## 2020-04-15 ENCOUNTER — Encounter (HOSPITAL_BASED_OUTPATIENT_CLINIC_OR_DEPARTMENT_OTHER): Payer: Self-pay

## 2020-04-15 ENCOUNTER — Ambulatory Visit (HOSPITAL_BASED_OUTPATIENT_CLINIC_OR_DEPARTMENT_OTHER)
Admission: RE | Admit: 2020-04-15 | Discharge: 2020-04-15 | Disposition: A | Discharge status: Home / Self Care | Payer: Medicare HMO | Source: Ambulatory Visit | Attending: Family Medicine | Admitting: Family Medicine

## 2020-04-15 DIAGNOSIS — Z1231 Encounter for screening mammogram for malignant neoplasm of breast: Secondary | ICD-10-CM | POA: Insufficient documentation

## 2020-04-24 DIAGNOSIS — M205X2 Other deformities of toe(s) (acquired), left foot: Secondary | ICD-10-CM | POA: Diagnosis not present

## 2020-04-24 DIAGNOSIS — M2042 Other hammer toe(s) (acquired), left foot: Secondary | ICD-10-CM | POA: Diagnosis not present

## 2020-04-24 DIAGNOSIS — S92911A Unspecified fracture of right toe(s), initial encounter for closed fracture: Secondary | ICD-10-CM | POA: Diagnosis not present

## 2020-04-24 DIAGNOSIS — M2041 Other hammer toe(s) (acquired), right foot: Secondary | ICD-10-CM | POA: Diagnosis not present

## 2020-05-01 DIAGNOSIS — L570 Actinic keratosis: Secondary | ICD-10-CM | POA: Diagnosis not present

## 2020-05-01 DIAGNOSIS — L814 Other melanin hyperpigmentation: Secondary | ICD-10-CM | POA: Diagnosis not present

## 2020-05-01 DIAGNOSIS — D1801 Hemangioma of skin and subcutaneous tissue: Secondary | ICD-10-CM | POA: Diagnosis not present

## 2020-05-01 DIAGNOSIS — L821 Other seborrheic keratosis: Secondary | ICD-10-CM | POA: Diagnosis not present

## 2020-05-01 DIAGNOSIS — D225 Melanocytic nevi of trunk: Secondary | ICD-10-CM | POA: Diagnosis not present

## 2020-05-01 DIAGNOSIS — L578 Other skin changes due to chronic exposure to nonionizing radiation: Secondary | ICD-10-CM | POA: Diagnosis not present

## 2020-05-01 DIAGNOSIS — L820 Inflamed seborrheic keratosis: Secondary | ICD-10-CM | POA: Diagnosis not present

## 2020-05-02 DIAGNOSIS — H353131 Nonexudative age-related macular degeneration, bilateral, early dry stage: Secondary | ICD-10-CM | POA: Diagnosis not present

## 2020-05-02 DIAGNOSIS — H5203 Hypermetropia, bilateral: Secondary | ICD-10-CM | POA: Diagnosis not present

## 2020-05-02 DIAGNOSIS — H2513 Age-related nuclear cataract, bilateral: Secondary | ICD-10-CM | POA: Diagnosis not present

## 2020-05-02 DIAGNOSIS — H52203 Unspecified astigmatism, bilateral: Secondary | ICD-10-CM | POA: Diagnosis not present

## 2020-05-02 DIAGNOSIS — H524 Presbyopia: Secondary | ICD-10-CM | POA: Diagnosis not present

## 2020-05-08 DIAGNOSIS — M25511 Pain in right shoulder: Secondary | ICD-10-CM | POA: Diagnosis not present

## 2020-05-27 DIAGNOSIS — M205X1 Other deformities of toe(s) (acquired), right foot: Secondary | ICD-10-CM | POA: Diagnosis not present

## 2020-05-27 DIAGNOSIS — M2041 Other hammer toe(s) (acquired), right foot: Secondary | ICD-10-CM | POA: Diagnosis not present

## 2020-06-10 DIAGNOSIS — M25511 Pain in right shoulder: Secondary | ICD-10-CM | POA: Diagnosis not present

## 2020-06-14 DIAGNOSIS — Z0100 Encounter for examination of eyes and vision without abnormal findings: Secondary | ICD-10-CM | POA: Diagnosis not present

## 2020-06-17 DIAGNOSIS — M25511 Pain in right shoulder: Secondary | ICD-10-CM | POA: Diagnosis not present

## 2020-07-08 DIAGNOSIS — M2041 Other hammer toe(s) (acquired), right foot: Secondary | ICD-10-CM | POA: Diagnosis not present

## 2020-07-08 DIAGNOSIS — M205X2 Other deformities of toe(s) (acquired), left foot: Secondary | ICD-10-CM | POA: Diagnosis not present

## 2020-07-08 DIAGNOSIS — S92911D Unspecified fracture of right toe(s), subsequent encounter for fracture with routine healing: Secondary | ICD-10-CM | POA: Diagnosis not present

## 2020-07-08 DIAGNOSIS — M205X1 Other deformities of toe(s) (acquired), right foot: Secondary | ICD-10-CM | POA: Diagnosis not present

## 2020-08-21 DIAGNOSIS — M5417 Radiculopathy, lumbosacral region: Secondary | ICD-10-CM | POA: Diagnosis not present

## 2020-08-21 DIAGNOSIS — M9904 Segmental and somatic dysfunction of sacral region: Secondary | ICD-10-CM | POA: Diagnosis not present

## 2020-08-21 DIAGNOSIS — M62830 Muscle spasm of back: Secondary | ICD-10-CM | POA: Diagnosis not present

## 2020-08-21 DIAGNOSIS — M25552 Pain in left hip: Secondary | ICD-10-CM | POA: Diagnosis not present

## 2020-08-21 DIAGNOSIS — M9903 Segmental and somatic dysfunction of lumbar region: Secondary | ICD-10-CM | POA: Diagnosis not present

## 2020-09-06 ENCOUNTER — Telehealth: Payer: Self-pay | Admitting: Family Medicine

## 2020-09-06 NOTE — Telephone Encounter (Signed)
Copied from [place] [phone]. Topic: Medicare AWV >> Sep 06, 2020  9:56 AM Dannenberg-Coley, Hannah Beat wrote: Reason for CRM: Left message for patient to schedule Annual Wellness Visit.  Please schedule with Health Nurse Advisor Augustine Radar. at [facility].

## 2020-09-12 DIAGNOSIS — H353132 Nonexudative age-related macular degeneration, bilateral, intermediate dry stage: Secondary | ICD-10-CM | POA: Diagnosis not present

## 2020-09-12 DIAGNOSIS — H43813 Vitreous degeneration, bilateral: Secondary | ICD-10-CM | POA: Diagnosis not present

## 2020-09-12 DIAGNOSIS — H43393 Other vitreous opacities, bilateral: Secondary | ICD-10-CM | POA: Diagnosis not present

## 2020-09-12 DIAGNOSIS — H3540 Unspecified peripheral retinal degeneration: Secondary | ICD-10-CM | POA: Diagnosis not present

## 2020-09-13 ENCOUNTER — Other Ambulatory Visit: Payer: Self-pay

## 2020-09-13 ENCOUNTER — Ambulatory Visit (INDEPENDENT_AMBULATORY_CARE_PROVIDER_SITE_OTHER): Payer: Medicare HMO | Admitting: Family Medicine

## 2020-09-13 ENCOUNTER — Encounter: Payer: Self-pay | Admitting: Family Medicine

## 2020-09-13 VITALS — BP 112/78 | HR 67 | Temp 97.9°F | Ht 64.5 in | Wt 125.1 lb

## 2020-09-13 DIAGNOSIS — E7800 Pure hypercholesterolemia, unspecified: Secondary | ICD-10-CM | POA: Diagnosis not present

## 2020-09-13 DIAGNOSIS — Z0000 Encounter for general adult medical examination without abnormal findings: Secondary | ICD-10-CM | POA: Diagnosis not present

## 2020-09-13 LAB — COMPREHENSIVE METABOLIC PANEL
ALT: 22 U/L (ref 0–35)
AST: 28 U/L (ref 0–37)
Albumin: 4.6 g/dL (ref 3.5–5.2)
Alkaline Phosphatase: 52 U/L (ref 39–117)
BUN: 14 mg/dL (ref 6–23)
CO2: 29 mEq/L (ref 19–32)
Calcium: 10.3 mg/dL (ref 8.4–10.5)
Chloride: 101 mEq/L (ref 96–112)
Creatinine, Ser: 0.86 mg/dL (ref 0.40–1.20)
GFR: 70.11 mL/min (ref >60.00)
Glucose, Bld: 77 mg/dL (ref 70–99)
Potassium: 4.1 mEq/L (ref 3.5–5.1)
Sodium: 139 mEq/L (ref 135–145)
Total Bilirubin: 0.6 mg/dL (ref 0.2–1.2)
Total Protein: 6.6 g/dL (ref 6.0–8.3)

## 2020-09-13 LAB — LIPID PANEL
Cholesterol: 208 mg/dL — ABNORMAL HIGH (ref 0–200)
HDL: 80.3 mg/dL (ref >39.00)
LDL Cholesterol: 101 mg/dL — ABNORMAL HIGH (ref 0–99)
NonHDL: 127.47
Total CHOL/HDL Ratio: 3
Triglycerides: 133 mg/dL (ref 0.0–149.0)
VLDL: 26.6 mg/dL (ref 0.0–40.0)

## 2020-09-13 NOTE — Progress Notes (Signed)
Subjective:   Sarah Flowers is a 64 y.o. female who presents for Medicare Annual (Subsequent) preventive examination.  I connected with Brandon today by telephone and verified that I am speaking with the correct person using two identifiers. Location patient: home Location provider: work Persons participating in the virtual visit: patient, Marine scientist.    I discussed the limitations, risks, security and privacy concerns of performing an evaluation and management service by telephone and the availability of in person appointments. I also discussed with the patient that there may be a patient responsible charge related to this service. The patient expressed understanding and verbally consented to this telephonic visit.    Interactive audio and video telecommunications were attempted between this provider and patient, however failed, due to patient having technical difficulties OR patient did not have access to video capability.  We continued and completed visit with audio only.  Some vital signs may be absent or patient reported.   Time Spent with patient on telephone encounter: 20 minutes   Review of Systems     Cardiac Risk Factors include: advanced age (>42men, >46 women)     Objective:    Today's Vitals   09/16/20 1020  Weight: 125 lb (56.7 kg)  Height: 5' 4.5" (1.638 m)   Body mass index is 21.12 kg/m.  Advanced Directives 09/16/2020  Does Patient Have a Medical Advance Directive? Yes  Type of Paramedic of [place];Living will  Copy of [place] in Chart? No - copy requested    Current Medications (verified) Outpatient Encounter Medications as of 09/16/2020  Medication Sig   Calcium Citrate-Vitamin D (CALCIUM + D PO) Take 1 capsule by mouth daily.   Multiple Vitamins-Minerals (PRESERVISION AREDS 2+MULTI VIT PO) Take by mouth.   vitamin E 200 UNIT capsule Take 200 Units by mouth daily.   [DISCONTINUED] Bacillus  Coagulans-Inulin (PROBIOTIC) 1-250 BILLION-MG CAPS Take by mouth daily.    [DISCONTINUED] diphenhydrAMINE (BENADRYL) 25 MG tablet Take 25 mg by mouth every 6 (six) hours as needed.   [DISCONTINUED] loratadine (CLARITIN) 10 MG tablet Take 10 mg by mouth daily.   No facility-administered encounter medications on file as of 09/16/2020.    Allergies (verified) Sulfa antibiotics   History: Past Medical History:  Diagnosis Date   Macular degeneration    Rosacea    Past Surgical History:  Procedure Laterality Date   ABDOMINAL HYSTERECTOMY  1997   AUGMENTATION MAMMAPLASTY Bilateral 8295   Silicone   BREAST BIOPSY Bilateral    multiple, all benign    BREAST EXCISIONAL BIOPSY Left    BREAST EXCISIONAL BIOPSY Left    BREAST EXCISIONAL BIOPSY Right    BREAST SURGERY     CESAREAN SECTION     Family History  Problem Relation Age of Onset   Multiple sclerosis Mother    Heart disease Father    Breast cancer Paternal Aunt    Social History   Socioeconomic History   Marital status: Married    Spouse name: Not on file   Number of children: Not on file   Years of education: Not on file   Highest education level: Not on file  Occupational History   Not on file  Tobacco Use   Smoking status: Never   Smokeless tobacco: Never  Vaping Use   Vaping Use: Never used  Substance and Sexual Activity   Alcohol use: Not Currently   Drug use: Never   Sexual activity: Yes  Other Topics Concern  Not on file  Social History Narrative   Not on file   Social Determinants of Health   Financial Resource Strain: Low Risk    Difficulty of Paying Living Expenses: Not hard at all  Food Insecurity: No Food Insecurity   Worried About Charity fundraiser in the Last Year: Never true   Arboriculturist in the Last Year: Never true  Transportation Needs: No Transportation Needs   Lack of Transportation (Medical): No   Lack of Transportation (Non-Medical): No  Physical Activity: Sufficiently Active    Days of Exercise per Week: 7 days   Minutes of Exercise per Session: 60 min  Stress: No Stress Concern Present   Feeling of Stress : Not at all  Social Connections: Moderately Integrated   Frequency of Communication with Friends and Family: More than three times a week   Frequency of Social Gatherings with Friends and Family: More than three times a week   Attends Religious Services: Never   Marine scientist or Organizations: Yes   Attends Music therapist: More than 4 times per year   Marital Status: Married    Tobacco Counseling Counseling given: Not Answered   Clinical Intake:  Pre-visit preparation completed: Yes  Pain : No/denies pain     Nutritional Status: BMI of 19-24  Normal Nutritional Risks: None Diabetes: No  How often do you need to have someone help you when you read instructions, pamphlets, or other written materials from your doctor or pharmacy?: 1 - Never  Diabetic?No  Interpreter Needed?: No  Information entered by :: Caroleen Hamman LPN   Activities of Daily Living In your present state of health, do you have any difficulty performing the following activities: 09/16/2020 09/13/2020  Hearing? N N  Vision? N N  Difficulty concentrating or making decisions? N N  Walking or climbing stairs? N N  Dressing or bathing? N N  Doing errands, shopping? N N  Preparing Food and eating ? N -  Using the Toilet? N -  In the past six months, have you accidently leaked urine? N -  Do you have problems with loss of bowel control? N -  Managing your Medications? N -  Managing your Finances? N -  Housekeeping or managing your Housekeeping? N -  Some recent data might be hidden    Patient Care Team: Shelda Pal, DO as PCP - General (Family Medicine)  Indicate any recent Medical Services you may have received from other than [facility] providers in the past year (date may be approximate).     Assessment:   This is a routine wellness  examination for Sarah Flowers.  Hearing/Vision screen Hearing Screening - Comments:: No hearing loss Vision Screening - Comments:: Wears glasses Last eye exam-08/2020-Dr. Iona Hansen  Dietary issues and exercise activities discussed: Current Exercise Habits: Home exercise routine, Type of exercise: walking;Other - see comments (running, cycling), Time (Minutes): 60, Frequency (Times/Week): 7, Weekly Exercise (Minutes/Week): 420, Intensity: Moderate, Exercise limited by: None identified   Goals Addressed             This Visit's Progress    Patient Stated       Maintain current active lifestyle        Depression Screen PHQ 2/9 Scores 09/16/2020 09/13/2020 09/11/2019  PHQ - 2 Score 0 0 0    Fall Risk Fall Risk  09/16/2020 09/13/2020 09/11/2019  Falls in the past year? 1 1 0  Number falls in past yr: 1  0 -  Comment - has had a fall while running -  Injury with Fall? 1 1 -  Risk for fall due to : History of fall(s) No Fall Risks -  Follow up Falls prevention discussed Falls evaluation completed -    FALL RISK PREVENTION PERTAINING TO THE HOME:  Any stairs in or around the home? Yes  If so, are there any without handrails? No  Home free of loose throw rugs in walkways, pet beds, electrical cords, etc? Yes  Adequate lighting in your home to reduce risk of falls? Yes   ASSISTIVE DEVICES UTILIZED TO PREVENT FALLS:  Life alert? No  Use of a cane, walker or w/c? No  Grab bars in the bathroom? Yes  Shower chair or bench in shower? No  Elevated toilet seat or a handicapped toilet? No   TIMED UP AND GO:  Was the test performed? No . Phone visit   Cognitive Function: Normal cognitive status assessed by this Nurse Health Advisor. No abnormalities found.          Immunizations Immunization History  Administered Date(s) Administered   Influenza-Unspecified 12/13/2019   PFIZER(Purple Top)SARS-COV-2 Vaccination 04/27/2019, 05/22/2019, 01/11/2020, 07/03/2020   Pneumococcal  Conjugate-13 11/16/2018   Pneumococcal Polysaccharide-23 01/13/2020   Tdap 06/01/2018    TDAP status: Up to date  Flu Vaccine status: Up to date  Pneumococcal vaccine status: Up to date  Covid-19 vaccine status: Completed vaccines  Qualifies for Shingles Vaccine? Yes   Zostavax completed No   Shingrix Completed?: No.    Education has been provided regarding the importance of this vaccine. Patient has been advised to call insurance company to determine out of pocket expense if they have not yet received this vaccine. Advised may also receive vaccine at local pharmacy or Health Dept. Verbalized acceptance and understanding.  Screening Tests Health Maintenance  Topic Date Due   Zoster Vaccines- Shingrix (1 of 2) Never done   INFLUENZA VACCINE  10/21/2020   MAMMOGRAM  04/15/2022   COLONOSCOPY (Pts 45-18yrs Insurance coverage will need to be confirmed)  09/21/2027   TETANUS/TDAP  05/31/2028   DEXA SCAN  Completed   COVID-19 Vaccine  Completed   Hepatitis C Screening  Completed   PNA vac Low Risk Adult  Completed   HPV VACCINES  Aged Out    Health Maintenance  Health Maintenance Due  Topic Date Due   Zoster Vaccines- Shingrix (1 of 2) Never done    Colorectal cancer screening: Type of screening: Colonoscopy. Completed 09/20/2017. Repeat every 10 years  Mammogram status: Completed Bilateral 04/15/2020. Repeat every year  Bone Density status: Completed 09/15/2019. Results reflect: Bone density results: OSTEOPENIA. Repeat every 2 years.  Lung Cancer Screening: (Low Dose CT Chest recommended if Age 25-80 years, 30 pack-year currently smoking OR have quit w/in 15years.) does not qualify.     Additional Screening:  Hepatitis C Screening: Completed 06/01/2019  Vision Screening: Recommended annual ophthalmology exams for early detection of glaucoma and other disorders of the eye. Is the patient up to date with their annual eye exam?  Yes  Who is the provider or what is the name of  the office in which the patient attends annual eye exams? Dr. Iona Hansen   Dental Screening: Recommended annual dental exams for proper oral hygiene  Community Resource Referral / Chronic Care Management: CRR required this visit?  No   CCM required this visit?  No      Plan:     I have personally reviewed  and noted the following in the patient's chart:   Medical and social history Use of alcohol, tobacco or illicit drugs  Current medications and supplements including opioid prescriptions.  Functional ability and status Nutritional status Physical activity Advanced directives List of other physicians Hospitalizations, surgeries, and ER visits in previous 12 months Vitals Screenings to include cognitive, depression, and falls Referrals and appointments  In addition, I have reviewed and discussed with patient certain preventive protocols, quality metrics, and best practice recommendations. A written personalized care plan for preventive services as well as general preventive health recommendations were provided to patient.   Due to this being a telephonic visit, the after visit summary with patients personalized plan was offered to patient via mail or my-chart. Patient would like to access on my-chart.   Marta Antu, LPN   0/60/1561  Nurse Health Advisor  Nurse Notes: None

## 2020-09-13 NOTE — Progress Notes (Signed)
Chief Complaint  Patient presents with   Annual Exam     Well Woman Sarah Flowers is here for a complete physical.   Her last physical was >1 year ago.  Current diet: in general, a "healthy" diet. Current exercise: cycling, running. Weight is stable and she denies daytime fatigue. Seatbelt? Yes  Health Maintenance Colonoscopy- No Shingrix- No DEXA- Yes Mammogram- Yes Tetanus- Yes Pneumonia- Yes Hep C screen- Yes  Past Medical History:  Diagnosis Date   Macular degeneration    Rosacea      Past Surgical History:  Procedure Laterality Date   ABDOMINAL HYSTERECTOMY  1997   AUGMENTATION MAMMAPLASTY Bilateral 6384   Silicone   BREAST BIOPSY Bilateral    multiple, all benign    BREAST EXCISIONAL BIOPSY Left    BREAST EXCISIONAL BIOPSY Left    BREAST EXCISIONAL BIOPSY Right    BREAST SURGERY     CESAREAN SECTION      Medications  Current Outpatient Medications on File Prior to Visit  Medication Sig Dispense Refill   Calcium Citrate-Vitamin D (CALCIUM + D PO) Take by mouth.     Multiple Vitamins-Minerals (PRESERVISION AREDS 2+MULTI VIT PO) Take by mouth.     vitamin E 200 UNIT capsule Take 200 Units by mouth daily.     Allergies Allergies  Allergen Reactions   Sulfa Antibiotics Other (See Comments)    Mouth sores    Review of Systems: Constitutional:  no fevers Eye:  no recent significant change in vision Ears:  No changes in hearing Nose/Mouth/Throat:  no complaints of nasal congestion, no sore throat Cardiovascular: no chest pain Respiratory:  No shortness of breath Gastrointestinal:  No change in bowel habits GU:  Female: negative for dysuria Integumentary:  no abnormal skin lesions reported Neurologic:  no headaches Endocrine:  denies unexplained weight changes  Exam BP 112/78   Pulse 67   Temp 97.9 F (36.6 C) (Oral)   Ht 5' 4.5" (1.638 m)   Wt 125 lb 2 oz (56.8 kg)   SpO2 97%   BMI 21.15 kg/m  General:  well developed, well nourished,  in no apparent distress Skin:  no significant moles, warts, or growths Head:  no masses, lesions, or tenderness Eyes:  pupils equal and round, sclera anicteric without injection Ears:  canals without lesions, TMs shiny without retraction, no obvious effusion, no erythema Nose:  nares patent, septum midline, mucosa normal, and no drainage or sinus tenderness Throat/Pharynx:  lips and gingiva without lesion; tongue and uvula midline; non-inflamed pharynx; no exudates or postnasal drainage Neck: neck supple without adenopathy, thyromegaly, or masses Lungs:  clear to auscultation, breath sounds equal bilaterally, no respiratory distress Cardio:  regular rate and rhythm, no bruits or LE edema Abdomen:  abdomen soft, nontender; bowel sounds normal; no masses or organomegaly Genital: Deferred Neuro:  gait normal; deep tendon reflexes normal and symmetric Psych: well oriented with normal range of affect and appropriate judgment/insight  Assessment and Plan  Well adult exam  Pure hypercholesterolemia - Plan: Comprehensive metabolic panel, Lipid panel   Well 65 y.o. female. Counseled on diet and exercise. Shingrix rec'd. To get at pharmacy.  Other orders as above. Follow up in 1 yr or prn. The patient voiced understanding and agreement to the plan.  [place], DO 09/13/20 1:10 PM

## 2020-09-13 NOTE — Patient Instructions (Addendum)
Give [place] 2-3 business days to get the results of your labs back.   Keep the diet clean and stay active.  The new Shingrix vaccine (for shingles) is a 2 shot series. It can make people feel low energy, achy and almost like they have the flu for 48 hours after injection. Please plan accordingly when deciding on when to get this shot. Call your pharmacy for an appointment to get this. The second shot of the series is less severe regarding the side effects, but it still lasts 48 hours.   OK to use Debrox (peroxide) in the ear to loosen up wax. Also recommend using a bulb syringe (for removing boogers from baby's noses) to flush through warm water and vinegar (3-4:1 ratio). An alternative, though more expensive, is an elephant ear washer wax removal kit. Do not use Q-tips as this can impact wax further.  Let us know if you need anything.

## 2020-09-16 ENCOUNTER — Ambulatory Visit (INDEPENDENT_AMBULATORY_CARE_PROVIDER_SITE_OTHER): Payer: Medicare HMO

## 2020-09-16 VITALS — Ht 64.5 in | Wt 125.0 lb

## 2020-09-16 DIAGNOSIS — Z0000 Encounter for general adult medical examination without abnormal findings: Secondary | ICD-10-CM

## 2020-09-16 NOTE — Patient Instructions (Signed)
[name] , Thank you for taking time to complete your Medicare Wellness Visit. I appreciate your ongoing commitment to your health goals. Please review the following plan we discussed and let me know if I can assist you in the future.   Screening recommendations/referrals: Colonoscopy: Completed 09/2017-Due 09/2022 Mammogram: Completed 04/15/2020-Due 04/15/2021 Bone Density: Completed 09/15/2019-Due 09/14/2021 Recommended yearly ophthalmology/optometry visit for glaucoma screening and checkup Recommended yearly dental visit for hygiene and checkup  Vaccinations: Influenza vaccine: Up to date Pneumococcal vaccine: Up to date Tdap vaccine: Up to date-Due-05/2028 Shingles vaccine: Discuss with pharmacy   Covid-19:Up to date  Advanced directives: Please bring a copy for your chart  Conditions/risks identified: See problem list  Next appointment: Follow up in one year for your annual wellness visit 09/22/2021 @ 10:20   Preventive Care 65 Years and Older, Female Preventive care refers to lifestyle choices and visits with your health care provider that can promote health and wellness. What does preventive care include? A yearly physical exam. This is also called an annual well check. Dental exams once or twice a year. Routine eye exams. Ask your health care provider how often you should have your eyes checked. Personal lifestyle choices, including: Daily care of your teeth and gums. Regular physical activity. Eating a healthy diet. Avoiding tobacco and drug use. Limiting alcohol use. Practicing safe sex. Taking low-dose aspirin every day. Taking vitamin and mineral supplements as recommended by your health care provider. What happens during an annual well check? The services and screenings done by your health care provider during your annual well check will depend on your age, overall health, lifestyle risk factors, and family history of disease. Counseling  Your health care provider may  ask you questions about your: Alcohol use. Tobacco use. Drug use. Emotional well-being. Home and relationship well-being. Sexual activity. Eating habits. History of falls. Memory and ability to understand (cognition). Work and work Statistician. Reproductive health. Screening  You may have the following tests or measurements: Height, weight, and BMI. Blood pressure. Lipid and cholesterol levels. These may be checked every 5 years, or more frequently if you are over 60 years old. Skin check. Lung cancer screening. You may have this screening every year starting at age 51 if you have a 30-pack-year history of smoking and currently smoke or have quit within the past 15 years. Fecal occult blood test (FOBT) of the stool. You may have this test every year starting at age 19. Flexible sigmoidoscopy or colonoscopy. You may have a sigmoidoscopy every 5 years or a colonoscopy every 10 years starting at age 3. Hepatitis C blood test. Hepatitis B blood test. Sexually transmitted disease (STD) testing. Diabetes screening. This is done by checking your blood sugar (glucose) after you have not eaten for a while (fasting). You may have this done every 1-3 years. Bone density scan. This is done to screen for osteoporosis. You may have this done starting at age 49. Mammogram. This may be done every 1-2 years. Talk to your health care provider about how often you should have regular mammograms. Talk with your health care provider about your test results, treatment options, and if necessary, the need for more tests. Vaccines  Your health care provider may recommend certain vaccines, such as: Influenza vaccine. This is recommended every year. Tetanus, diphtheria, and acellular pertussis (Tdap, Td) vaccine. You may need a Td booster every 10 years. Zoster vaccine. You may need this after age 16. Pneumococcal 13-valent conjugate (PCV13) vaccine. One dose is recommended after  age 20. Pneumococcal  polysaccharide (PPSV23) vaccine. One dose is recommended after age 7. Talk to your health care provider about which screenings and vaccines you need and how often you need them. This information is not intended to replace advice given to you by your health care provider. Make sure you discuss any questions you have with your health care provider. Document Released: 04/05/2015 Document Revised: 11/27/2015 Document Reviewed: 01/08/2015 Elsevier Interactive Patient Education  2017 [place] Prevention in the Home Falls can cause injuries. They can happen to people of all ages. There are many things you can do to make your home safe and to help prevent falls. What can I do on the outside of my home? Regularly fix the edges of walkways and driveways and fix any cracks. Remove anything that might make you trip as you walk through a door, such as a raised step or threshold. Trim any bushes or trees on the path to your home. Use bright outdoor lighting. Clear any walking paths of anything that might make someone trip, such as rocks or tools. Regularly check to see if handrails are loose or broken. Make sure that both sides of any steps have handrails. Any raised decks and porches should have guardrails on the edges. Have any leaves, snow, or ice cleared regularly. Use sand or salt on walking paths during winter. Clean up any spills in your garage right away. This includes oil or grease spills. What can I do in the bathroom? Use night lights. Install grab bars by the toilet and in the tub and shower. Do not use towel bars as grab bars. Use non-skid mats or decals in the tub or shower. If you need to sit down in the shower, use a plastic, non-slip stool. Keep the floor dry. Clean up any water that spills on the floor as soon as it happens. Remove soap buildup in the tub or shower regularly. Attach bath mats securely with double-sided non-slip rug tape. Do not have throw rugs and other  things on the floor that can make you trip. What can I do in the bedroom? Use night lights. Make sure that you have a light by your bed that is easy to reach. Do not use any sheets or blankets that are too big for your bed. They should not hang down onto the floor. Have a firm chair that has side arms. You can use this for support while you get dressed. Do not have throw rugs and other things on the floor that can make you trip. What can I do in the kitchen? Clean up any spills right away. Avoid walking on wet floors. Keep items that you use a lot in easy-to-reach places. If you need to reach something above you, use a strong step stool that has a grab bar. Keep electrical cords out of the way. Do not use floor polish or wax that makes floors slippery. If you must use wax, use non-skid floor wax. Do not have throw rugs and other things on the floor that can make you trip. What can I do with my stairs? Do not leave any items on the stairs. Make sure that there are handrails on both sides of the stairs and use them. Fix handrails that are broken or loose. Make sure that handrails are as long as the stairways. Check any carpeting to make sure that it is firmly attached to the stairs. Fix any carpet that is loose or worn. Avoid having throw rugs  at the top or bottom of the stairs. If you do have throw rugs, attach them to the floor with carpet tape. Make sure that you have a light switch at the top of the stairs and the bottom of the stairs. If you do not have them, ask someone to add them for you. What else can I do to help prevent falls? Wear shoes that: Do not have high heels. Have rubber bottoms. Are comfortable and fit you well. Are closed at the toe. Do not wear sandals. If you use a stepladder: Make sure that it is fully opened. Do not climb a closed stepladder. Make sure that both sides of the stepladder are locked into place. Ask someone to hold it for you, if possible. Clearly  mark and make sure that you can see: Any grab bars or handrails. First and last steps. Where the edge of each step is. Use tools that help you move around (mobility aids) if they are needed. These include: Canes. Walkers. Scooters. Crutches. Turn on the lights when you go into a dark area. Replace any light bulbs as soon as they burn out. Set up your furniture so you have a clear path. Avoid moving your furniture around. If any of your floors are uneven, fix them. If there are any pets around you, be aware of where they are. Review your medicines with your doctor. Some medicines can make you feel dizzy. This can increase your chance of falling. Ask your doctor what other things that you can do to help prevent falls. This information is not intended to replace advice given to you by your health care provider. Make sure you discuss any questions you have with your health care provider. Document Released: 01/03/2009 Document Revised: 08/15/2015 Document Reviewed: 04/13/2014 Elsevier Interactive Patient Education  2017 [employer].

## 2020-09-18 DIAGNOSIS — M62830 Muscle spasm of back: Secondary | ICD-10-CM | POA: Diagnosis not present

## 2020-09-18 DIAGNOSIS — M9901 Segmental and somatic dysfunction of cervical region: Secondary | ICD-10-CM | POA: Diagnosis not present

## 2020-09-18 DIAGNOSIS — M9903 Segmental and somatic dysfunction of lumbar region: Secondary | ICD-10-CM | POA: Diagnosis not present

## 2020-09-18 DIAGNOSIS — M546 Pain in thoracic spine: Secondary | ICD-10-CM | POA: Diagnosis not present

## 2020-09-18 DIAGNOSIS — M9902 Segmental and somatic dysfunction of thoracic region: Secondary | ICD-10-CM | POA: Diagnosis not present

## 2020-09-18 DIAGNOSIS — M5413 Radiculopathy, cervicothoracic region: Secondary | ICD-10-CM | POA: Diagnosis not present

## 2020-11-27 DIAGNOSIS — Z7722 Contact with and (suspected) exposure to environmental tobacco smoke (acute) (chronic): Secondary | ICD-10-CM | POA: Diagnosis not present

## 2020-11-27 DIAGNOSIS — Z882 Allergy status to sulfonamides status: Secondary | ICD-10-CM | POA: Diagnosis not present

## 2020-11-27 DIAGNOSIS — Z8249 Family history of ischemic heart disease and other diseases of the circulatory system: Secondary | ICD-10-CM | POA: Diagnosis not present

## 2020-11-27 DIAGNOSIS — R030 Elevated blood-pressure reading, without diagnosis of hypertension: Secondary | ICD-10-CM | POA: Diagnosis not present

## 2020-11-27 DIAGNOSIS — H3530 Unspecified macular degeneration: Secondary | ICD-10-CM | POA: Diagnosis not present

## 2020-12-04 ENCOUNTER — Encounter: Payer: Self-pay | Admitting: Obstetrics & Gynecology

## 2020-12-04 ENCOUNTER — Ambulatory Visit (INDEPENDENT_AMBULATORY_CARE_PROVIDER_SITE_OTHER): Payer: Medicare HMO | Admitting: Obstetrics & Gynecology

## 2020-12-04 ENCOUNTER — Other Ambulatory Visit: Payer: Self-pay

## 2020-12-04 VITALS — BP 121/77 | HR 68 | Ht 64.5 in | Wt 126.0 lb

## 2020-12-04 DIAGNOSIS — N952 Postmenopausal atrophic vaginitis: Secondary | ICD-10-CM

## 2020-12-04 DIAGNOSIS — M8580 Other specified disorders of bone density and structure, unspecified site: Secondary | ICD-10-CM | POA: Diagnosis not present

## 2020-12-04 DIAGNOSIS — Z780 Asymptomatic menopausal state: Secondary | ICD-10-CM | POA: Diagnosis not present

## 2020-12-04 DIAGNOSIS — Z01419 Encounter for gynecological examination (general) (routine) without abnormal findings: Secondary | ICD-10-CM | POA: Diagnosis not present

## 2020-12-04 MED ORDER — ESTRADIOL 0.1 MG/GM VA CREA
TOPICAL_CREAM | VAGINAL | 2 refills | Status: DC
Start: 2020-12-04 — End: 2021-03-05

## 2020-12-04 NOTE — Progress Notes (Signed)
Subjective:     Sarah Flowers is a 65 y.o. female here for a routine exam.  Current complaints: Pt reports that the atrophic vaginitis is no longer responding to conservative measures. She reports sx with intercourse but, also reports burning and discomfort with biking and just in general.  Dexa scan 2021: osteopenia   Gynecologic History No LMP recorded. Patient is postmenopausal. Contraception: post menopausal status Last Pap: s/p hyst.  Last mammogram: 04/15/2020. Results were: normal  Obstetric History OB History  Gravida Para Term Preterm AB Living  '3 3 2 1      '$ SAB IAB Ectopic Multiple Live Births          3    # Outcome Date GA Lbr Len/2nd Weight Sex Delivery Anes PTL Lv  3 Preterm  [redacted]w[redacted]d  F CS-LTranv        Complications: Abruptio Placenta  2 Term      Vag-Spont     1 Term      Vag-Spont      The following portions of the patient's history were reviewed and updated as appropriate: allergies, current medications, past family history, past medical history, past social history, past surgical history, and problem list.  Review of Systems Pertinent items are noted in HPI.    Objective:  BP 121/77   Pulse 68   Ht 5' 4.5" (1.638 m)   Wt 126 lb (57.2 kg)   BMI 21.29 kg/m  General Appearance:    Alert, cooperative, no distress, appears stated age  Head:    Normocephalic, without obvious abnormality, atraumatic  Eyes:    conjunctiva/corneas clear, EOM's intact, both eyes  Ears:    Normal external ear canals, both ears  Nose:   Nares normal, septum midline, mucosa normal, no drainage    or sinus tenderness  Throat:   Lips, mucosa, and tongue normal; teeth and gums normal  Neck:   Supple, symmetrical, trachea midline, no adenopathy;    thyroid:  no enlargement/tenderness/nodules  Back:     Symmetric, no curvature, ROM normal, no CVA tenderness  Lungs:     respirations unlabored  Chest Wall:    No tenderness or deformity   Heart:    Regular rate and rhythm  Breast Exam:     No tenderness, masses, or nipple abnormality  Abdomen:     Soft, non-tender, bowel sounds active all four quadrants,    no masses, no organomegaly  Genitalia:    Normal female without lesion, discharge or tenderness   Tissues are atrophic but, without lesions.     Extremities:   Extremities normal, atraumatic, no cyanosis or edema  Pulses:   2+ and symmetric all extremities  Skin:   Skin color, texture, turgor normal, no rashes or lesions     Assessment:    Healthy female exam.  Osteopenia- pt on Vit D and Ca++; pt also runs for exercise.  Atrophic vaginitis - conservative measures no longer working.    Plan:  Keep Vit D and Ca++ Reviewed strength training exercises Repeat DEXA in 1 year Reviewed management options for GSM. Pt would like to begin topical EES.   Diagnoses and all orders for this visit:  Well female exam with routine gynecological exam  Osteopenia after menopause  Atrophic vaginitis -     estradiol (ESTRACE VAGINAL) 0.1 MG/GM vaginal cream; Insert 1 gram vaginally for 2 weeks followed by 3 times per week.     [name], M.D., FCherlynn June

## 2020-12-04 NOTE — Patient Instructions (Signed)
Osteopenia Osteopenia is a loss of thickness (density) inside the bones. Another name for osteopenia is low bone mass. Mild osteopenia is a normal part of aging. It is not a disease, and it does not cause symptoms. However, if you have osteopenia and continue to lose bone mass, you could develop a condition that causes the bones to become thin and break more easily (osteoporosis). Osteoporosis can cause you to lose some height, have back pain, and have a stooped posture. Although osteopenia is not a disease, making changes to your lifestyle and diet can help to prevent osteopenia from developing into osteoporosis. What are the causes? Osteopenia is caused by loss of calcium in the bones. Bones are constantly changing. Old bone cells are continually being replaced with new bone cells. This process builds new bone. The mineral calcium is needed to build new bone and maintain bone density. Bone density is usually highest around age 38. After that, most people's bodies cannot replace all the bone they have lost with new bone. What increases the risk? You are more likely to develop this condition if: You are older than age 73. You are a woman who went through menopause early. You have a long illness that keeps you in bed. You do not get enough exercise. You lack certain nutrients (malnutrition). You have an overactive thyroid gland (hyperthyroidism). You use products that contain nicotine or tobacco, such as cigarettes, e-cigarettes and chewing tobacco, or you drink a lot of alcohol. You are taking medicines that weaken the bones, such as steroids. What are the signs or symptoms? This condition does not cause any symptoms. You may have a slightly higher risk for bone breaks (fractures), so getting fractures more easily than normal may be an indication of osteopenia. How is this diagnosed? This condition may be diagnosed based on an X-ray exam that measures bone density (dual-energy X-ray  absorptiometry, or DEXA). This test can measure bone density in your hips, spine, and wrists. Osteopenia has no symptoms, so this condition is usually diagnosed after a routine bone density screening test is done for osteoporosis. This routine screening is usually done for: Women who are age 34 or older. Men who are age 6 or older. If you have risk factors for osteopenia, you may have the screening test at an earlier age. How is this treated? Making dietary and lifestyle changes can lower your risk for osteoporosis. If you have severe osteopenia that is close to becoming osteoporosis, this condition can be treated with medicines and dietary supplements such as calcium and vitamin D. These supplements help to rebuild bone density. Follow these instructions at home: Eating and drinking Eat a diet that is high in calcium and vitamin D. Calcium is found in dairy products, beans, salmon, and leafy green vegetables like spinach and broccoli. Look for foods that have vitamin D and calcium added to them (fortified foods), such as orange juice, cereal, and bread.  Lifestyle Do 30 minutes or more of a weight-bearing exercise every day, such as walking, jogging, or playing a sport. These types of exercises strengthen the bones. Do not use any products that contain nicotine or tobacco, such as cigarettes, e-cigarettes, and chewing tobacco. If you need help quitting, ask your health care provider. Do not drink alcohol if: Your health care provider tells you not to drink. You are pregnant, may be pregnant, or are planning to become pregnant. If you drink alcohol: Limit how much you use to: 0-1 drink a day for women. 0-2 drinks  a day for men. Be aware of how much alcohol is in your drink. In the [place] one drink equals one 12 oz bottle of beer (355 mL), one 5 oz glass of wine (148 mL), or one 1 oz glass of hard liquor (44 mL). General instructions Take over-the-counter and prescription medicines only as  told by your health care provider. These include vitamins and supplements. Take precautions at home to lower your risk of falling, such as: Keeping rooms well-lit and free of clutter, such as cords. Installing safety rails on stairs. Using rubber mats in the bathroom or other areas that are often wet or slippery. Keep all follow-up visits. This is important. Contact a health care provider if: You have not had a bone density screening for osteoporosis and you are: A woman who is age 92 or older. A man who is age 32 or older. You are a postmenopausal woman who has not had a bone density screening for osteoporosis. You are older than age 26 and you want to know if you should have bone density screening for osteoporosis. Summary Osteopenia is a loss of thickness (density) inside the bones. Another name for osteopenia is low bone mass. Osteopenia is not a disease, but it may increase your risk for a condition that causes the bones to become thin and break more easily (osteoporosis). You may be at risk for osteopenia if you are older than age 30 or if you are a woman who went through early menopause. Osteopenia does not cause any symptoms, but it can be diagnosed with a bone density screening test. Dietary and lifestyle changes are the first treatment for osteopenia. These may lower your risk for osteoporosis. This information is not intended to replace advice given to you by your health care provider. Make sure you discuss any questions you have with your health care provider. Document Revised: 08/24/2019 Document Reviewed: 08/24/2019 Elsevier Patient Education  [place].

## 2021-01-14 ENCOUNTER — Telehealth: Payer: Self-pay | Admitting: General Practice

## 2021-01-14 NOTE — Telephone Encounter (Signed)
Left message on VM for patient to contact our office to schedule 3 month follow up with Dr. Eugenie Norrie in December 2022.

## 2021-01-15 DIAGNOSIS — F4320 Adjustment disorder, unspecified: Secondary | ICD-10-CM | POA: Diagnosis not present

## 2021-01-15 DIAGNOSIS — R69 Illness, unspecified: Secondary | ICD-10-CM | POA: Diagnosis not present

## 2021-01-22 DIAGNOSIS — F4320 Adjustment disorder, unspecified: Secondary | ICD-10-CM | POA: Diagnosis not present

## 2021-01-22 DIAGNOSIS — R69 Illness, unspecified: Secondary | ICD-10-CM | POA: Diagnosis not present

## 2021-01-29 DIAGNOSIS — F4320 Adjustment disorder, unspecified: Secondary | ICD-10-CM | POA: Diagnosis not present

## 2021-01-29 DIAGNOSIS — R69 Illness, unspecified: Secondary | ICD-10-CM | POA: Diagnosis not present

## 2021-02-05 DIAGNOSIS — F4320 Adjustment disorder, unspecified: Secondary | ICD-10-CM | POA: Diagnosis not present

## 2021-02-05 DIAGNOSIS — R69 Illness, unspecified: Secondary | ICD-10-CM | POA: Diagnosis not present

## 2021-02-12 DIAGNOSIS — F4320 Adjustment disorder, unspecified: Secondary | ICD-10-CM | POA: Diagnosis not present

## 2021-02-12 DIAGNOSIS — R69 Illness, unspecified: Secondary | ICD-10-CM | POA: Diagnosis not present

## 2021-02-19 DIAGNOSIS — R69 Illness, unspecified: Secondary | ICD-10-CM | POA: Diagnosis not present

## 2021-02-19 DIAGNOSIS — F4320 Adjustment disorder, unspecified: Secondary | ICD-10-CM | POA: Diagnosis not present

## 2021-03-05 ENCOUNTER — Ambulatory Visit: Payer: Medicare HMO | Admitting: Obstetrics & Gynecology

## 2021-03-05 ENCOUNTER — Encounter: Payer: Self-pay | Admitting: Obstetrics & Gynecology

## 2021-03-05 ENCOUNTER — Other Ambulatory Visit: Payer: Self-pay

## 2021-03-05 DIAGNOSIS — N952 Postmenopausal atrophic vaginitis: Secondary | ICD-10-CM

## 2021-03-05 DIAGNOSIS — F4320 Adjustment disorder, unspecified: Secondary | ICD-10-CM | POA: Diagnosis not present

## 2021-03-05 DIAGNOSIS — R69 Illness, unspecified: Secondary | ICD-10-CM | POA: Diagnosis not present

## 2021-03-05 MED ORDER — ESTRADIOL 0.1 MG/GM VA CREA
TOPICAL_CREAM | VAGINAL | 10 refills | Status: AC
Start: 2021-03-05 — End: ?

## 2021-03-05 NOTE — Progress Notes (Signed)
History:  65 y.o. G3P2100 here today for f/u of atrophic vaginitis. Pt reports that her sx have completely resolved. She reports that she new she was improved when she was able to insert the applicator. She reports no new sx. She is using the topicla EES once weekly. She reports irritation if she uses it more often.    The following portions of the patient's history were reviewed and updated as appropriate: allergies, current medications, past family history, past medical history, past social history, past surgical history and problem list.  Review of Systems:  Pertinent items are noted in HPI.    Objective:  Physical Exam Blood pressure 118/71, pulse 82, weight 126 lb (57.2 kg).  CONSTITUTIONAL: Well-developed, well-nourished female in no acute distress.  HENT:  Normocephalic, atraumatic EYES: Conjunctivae and EOM are normal. No scleral icterus.  NECK: Normal range of motion SKIN: Skin is warm and dry. No rash noted. Not diaphoretic.No pallor. [place]: Alert and oriented to person, place, and time. Normal coordination.  Pelvic: deferred   Assessment & Plan:  Diagnoses and all orders for this visit:  Atrophic vaginitis -     estradiol (ESTRACE VAGINAL) 0.1 MG/GM vaginal cream; Insert 1 gram vaginally for 2 weeks followed by 3 times per week.  F/u for annual in 9 months or sooner prn   Melchizedek Espinola L. Harraway-Smith, M.D., [facility]

## 2021-03-05 NOTE — Progress Notes (Signed)
Patient is follow up for starting estrace vaginal cream. Patient states she has seen improvement. She is only using once a week and having the improvement. Kathrene Alu RN

## 2021-03-20 ENCOUNTER — Telehealth (HOSPITAL_BASED_OUTPATIENT_CLINIC_OR_DEPARTMENT_OTHER): Payer: Self-pay

## 2021-03-20 ENCOUNTER — Encounter: Payer: Self-pay | Admitting: Family Medicine

## 2021-03-21 ENCOUNTER — Other Ambulatory Visit: Payer: Self-pay | Admitting: Family Medicine

## 2021-03-21 DIAGNOSIS — Z1231 Encounter for screening mammogram for malignant neoplasm of breast: Secondary | ICD-10-CM

## 2021-03-21 DIAGNOSIS — F4320 Adjustment disorder, unspecified: Secondary | ICD-10-CM | POA: Diagnosis not present

## 2021-03-21 DIAGNOSIS — R69 Illness, unspecified: Secondary | ICD-10-CM | POA: Diagnosis not present

## 2021-04-05 ENCOUNTER — Other Ambulatory Visit (HOSPITAL_BASED_OUTPATIENT_CLINIC_OR_DEPARTMENT_OTHER): Payer: Self-pay | Admitting: Family Medicine

## 2021-04-05 DIAGNOSIS — Z1231 Encounter for screening mammogram for malignant neoplasm of breast: Secondary | ICD-10-CM

## 2021-04-18 DIAGNOSIS — M65312 Trigger thumb, left thumb: Secondary | ICD-10-CM | POA: Diagnosis not present

## 2021-04-21 ENCOUNTER — Encounter (HOSPITAL_BASED_OUTPATIENT_CLINIC_OR_DEPARTMENT_OTHER): Payer: Medicare HMO

## 2021-04-21 ENCOUNTER — Ambulatory Visit (HOSPITAL_BASED_OUTPATIENT_CLINIC_OR_DEPARTMENT_OTHER)
Admission: RE | Admit: 2021-04-21 | Discharge: 2021-04-21 | Disposition: A | Discharge status: Home / Self Care | Payer: Medicare HMO | Source: Ambulatory Visit | Attending: Family Medicine | Admitting: Family Medicine

## 2021-04-21 ENCOUNTER — Other Ambulatory Visit: Payer: Self-pay

## 2021-04-21 ENCOUNTER — Encounter (HOSPITAL_BASED_OUTPATIENT_CLINIC_OR_DEPARTMENT_OTHER): Payer: Self-pay

## 2021-04-21 DIAGNOSIS — Z1231 Encounter for screening mammogram for malignant neoplasm of breast: Secondary | ICD-10-CM | POA: Diagnosis not present

## 2021-05-09 ENCOUNTER — Ambulatory Visit (INDEPENDENT_AMBULATORY_CARE_PROVIDER_SITE_OTHER): Payer: Medicare HMO | Admitting: Family Medicine

## 2021-05-09 ENCOUNTER — Encounter: Payer: Self-pay | Admitting: Family Medicine

## 2021-05-09 VITALS — BP 118/80 | HR 70 | Temp 97.9°F | Ht 64.0 in | Wt 124.2 lb

## 2021-05-09 DIAGNOSIS — K625 Hemorrhage of anus and rectum: Secondary | ICD-10-CM

## 2021-05-09 LAB — CBC
HCT: 38.2 % (ref 36.0–46.0)
Hemoglobin: 12.8 g/dL (ref 12.0–15.0)
MCHC: 33.4 g/dL (ref 30.0–36.0)
MCV: 91.1 fl (ref 78.0–100.0)
Platelets: 269 10*3/uL (ref 150.0–400.0)
RBC: 4.2 Mil/uL (ref 3.87–5.11)
RDW: 14.4 % (ref 11.5–15.5)
WBC: 4.8 10*3/uL (ref 4.0–10.5)

## 2021-05-09 NOTE — Progress Notes (Signed)
Chief Complaint  Patient presents with   Rectal Bleeding    6 months     Subjective: Patient is a 66 y.o. female here for rectal bleeding.  6 mo of intermittent rectal bleeding. Usually bright red on tissue or mixed in w stool. Colonoscopy done in [place] 09/20/17 w rec'd 5 yr f/u. Internal hemorrhoids were noted. No pain or injury. Sometimes will have fecal smearing that can lead to irritation. Does not strain much with her bowel movements. No blood in urine, fevers, N/V/D, abd pain, dizziness/lightheadedness.   Past Medical History:  Diagnosis Date   Macular degeneration    Rosacea     Objective: BP 118/80    Pulse 70    Temp 97.9 F (36.6 C) (Oral)    Ht 5\' 4"  (1.626 m)    Wt 124 lb 4 oz (56.4 kg)    SpO2 97%    BMI 21.33 kg/m  General: Awake, appears stated age Heart: RRR Abd: BS+, S, NT, ND Lungs: No accessory muscle use Psych: Age appropriate judgment and insight, normal affect and mood  Assessment and Plan: BRBPR (bright red blood per rectum) - Plan: CBC, Ambulatory referral to Gastroenterology  Refer GI. Ck CBC. Further recs pending that. Deferred rectal exam as she is going to see GI either way. F/u as originally scheduled.  The patient voiced understanding and agreement to the plan.  [place], DO 05/09/21  1:51 PM

## 2021-05-09 NOTE — Patient Instructions (Addendum)
If you do not hear anything about your referral in the next 1-2 weeks, call our office and ask for an update.  Take Metamucil or Benefiber daily.  Give [place] 2-3 business days to get the results of your labs back.   Let us know if you need anything.

## 2021-05-19 DIAGNOSIS — D229 Melanocytic nevi, unspecified: Secondary | ICD-10-CM | POA: Diagnosis not present

## 2021-05-19 DIAGNOSIS — L814 Other melanin hyperpigmentation: Secondary | ICD-10-CM | POA: Diagnosis not present

## 2021-05-19 DIAGNOSIS — L578 Other skin changes due to chronic exposure to nonionizing radiation: Secondary | ICD-10-CM | POA: Diagnosis not present

## 2021-05-19 DIAGNOSIS — Z411 Encounter for cosmetic surgery: Secondary | ICD-10-CM | POA: Diagnosis not present

## 2021-05-19 DIAGNOSIS — L719 Rosacea, unspecified: Secondary | ICD-10-CM | POA: Diagnosis not present

## 2021-05-19 DIAGNOSIS — L821 Other seborrheic keratosis: Secondary | ICD-10-CM | POA: Diagnosis not present

## 2021-05-19 DIAGNOSIS — D1801 Hemangioma of skin and subcutaneous tissue: Secondary | ICD-10-CM | POA: Diagnosis not present

## 2021-05-19 DIAGNOSIS — Z23 Encounter for immunization: Secondary | ICD-10-CM | POA: Diagnosis not present

## 2021-05-23 ENCOUNTER — Encounter: Payer: Self-pay | Admitting: Gastroenterology

## 2021-05-23 ENCOUNTER — Encounter: Payer: Self-pay | Admitting: Family Medicine

## 2021-06-09 ENCOUNTER — Ambulatory Visit: Payer: Medicare HMO | Admitting: Gastroenterology

## 2021-06-09 ENCOUNTER — Encounter: Payer: Self-pay | Admitting: Gastroenterology

## 2021-06-09 VITALS — BP 128/84 | HR 79 | Ht 64.0 in | Wt 123.2 lb

## 2021-06-09 DIAGNOSIS — K625 Hemorrhage of anus and rectum: Secondary | ICD-10-CM

## 2021-06-09 MED ORDER — NA SULFATE-K SULFATE-MG SULF 17.5-3.13-1.6 GM/177ML PO SOLN: 1.0000 | Freq: Once | ORAL | 0 refills | Status: AC

## 2021-06-09 NOTE — Progress Notes (Signed)
? ? ?[facility] Consult Note: ? ?History: ?Sarah Flowers ?06/09/2021 ? ?Referring provider: Shelda Pal, DO ? ?Reason for consult/chief complaint: Rectal Bleeding (Intermittent BRBPR for about 6 months when patient wipes.  History of hemorrhoids in the past. ) and Encopresis (Fecal incontinence--has had some leakage in underwear recently. ) ? ? ?Subjective  ?HPI: ?Saw primary care February 17 for at least 6 months of intermittent rectal bleeding.  Dr. Nani Ravens made note of a colonoscopy in [facility] July 2019 with internal hemorrhoids reported and 5-year recall recommended. ? ?Sarah Flowers reports about 6 months of intermittent painless bleeding, sometimes associated with urgency.  She has had some small-volume fecal incontinence and feels there may occasionally be tissue protruding and she feels incompletely evacuated after BM.  No family history of Crohn's or colitis.  Denies abdominal pain, painful eye redness, rash or joint swelling. ?She had photos of her last colonoscopy, they show complete exam with photos of the cecum and a retroflexion that does not clearly show enlargement or inflammation of the hemorrhoidal plexus. ? ?ROS: ? ?Review of Systems  ?Constitutional:  Negative for appetite change and unexpected weight change.  ?HENT:  Negative for mouth sores and voice change.   ?Eyes:  Negative for pain and redness.  ?Respiratory:  Negative for cough and shortness of breath.   ?Cardiovascular:  Negative for chest pain and palpitations.  ?Genitourinary:  Negative for dysuria and hematuria.  ?Musculoskeletal:  Negative for arthralgias and myalgias.  ?Skin:  Negative for pallor and rash.  ?Neurological:  Negative for weakness and headaches.  ?Hematological:  Negative for adenopathy.  ? ? ?Past Medical History: ?Past Medical History:  ?Diagnosis Date  ? GERD (gastroesophageal reflux disease)   ? History of colon polyps   ? Macular degeneration   ? Rosacea   ? ? ? ?Past Surgical  History: ?Past Surgical History:  ?Procedure Laterality Date  ? ABDOMINAL HYSTERECTOMY  1997  ? AUGMENTATION MAMMAPLASTY Bilateral 2009  ? Silicone  ? BREAST BIOPSY Bilateral   ? multiple, all benign   ? BREAST EXCISIONAL BIOPSY Left   ? BREAST EXCISIONAL BIOPSY Left   ? BREAST EXCISIONAL BIOPSY Right   ? BREAST SURGERY    ? CESAREAN SECTION    ? COLONOSCOPY    ? ? ? ?Family History: ?Family History  ?Problem Relation Age of Onset  ? Multiple sclerosis Mother   ? Colon polyps Father   ? Heart disease Father   ? Breast cancer Paternal Aunt   ? Colon cancer Neg Hx   ? Esophageal cancer Neg Hx   ? Pancreatic cancer Neg Hx   ? Stomach cancer Neg Hx   ? ? ?Social History: ?Social History  ? ?Socioeconomic History  ? Marital status: Married  ?  Spouse name: Not on file  ? Number of children: Not on file  ? Years of education: Not on file  ? Highest education level: Not on file  ?Occupational History  ? Not on file  ?Tobacco Use  ? Smoking status: Never  ? Smokeless tobacco: Never  ?Vaping Use  ? Vaping Use: Never used  ?Substance and Sexual Activity  ? Alcohol use: Not Currently  ? Drug use: Never  ? Sexual activity: Yes  ?Other Topics Concern  ? Not on file  ?Social History Narrative  ? Not on file  ? ?Social Determinants of Health  ? ?Financial Resource Strain: Low Risk   ? Difficulty of Paying Living Expenses: Not hard at all  ?Food  Insecurity: No Food Insecurity  ? Worried About Charity fundraiser in the Last Year: Never true  ? Ran Out of Food in the Last Year: Never true  ?Transportation Needs: No Transportation Needs  ? Lack of Transportation (Medical): No  ? Lack of Transportation (Non-Medical): No  ?Physical Activity: Sufficiently Active  ? Days of Exercise per Week: 7 days  ? Minutes of Exercise per Session: 60 min  ?Stress: No Stress Concern Present  ? Feeling of Stress : Not at all  ?Social Connections: Moderately Integrated  ? Frequency of Communication with Friends and Family: More than three times a week   ? Frequency of Social Gatherings with Friends and Family: More than three times a week  ? Attends Religious Services: Never  ? Active Member of Clubs or Organizations: Yes  ? Attends Archivist Meetings: More than 4 times per year  ? Marital Status: Married  ? ?Ran a half marathon in [place] over the weekend. ? ?Allergies: ?Allergies  ?Allergen Reactions  ? Sulfa Antibiotics Other (See Comments)  ?  Mouth sores  ? ? ?Outpatient Meds: ?Current Outpatient Medications  ?Medication Sig Dispense Refill  ? Calcium Citrate-Vitamin D (CALCIUM + D PO) Take 1 capsule by mouth daily.    ? cholecalciferol (VITAMIN D3) 25 MCG (1000 UNIT) tablet Take 1,000 Units by mouth daily.    ? estradiol (ESTRACE VAGINAL) 0.1 MG/GM vaginal cream Insert 1 gram vaginally for 2 weeks followed by 3 times per week. 42.5 g 10  ? Multiple Vitamins-Minerals (PRESERVISION AREDS 2+MULTI VIT PO) Take 2 tablets by mouth daily.    ? vitamin E 200 UNIT capsule Take 200 Units by mouth daily.    ? ?No current facility-administered medications for this visit.  ? ? ? ? ?___________________________________________________________________ ?Objective  ? ?Exam: ? ?BP 128/84   Pulse 79   Ht '5\' 4"'$  (1.626 m)   Wt 123 lb 4 oz (55.9 kg)   BMI 21.16 kg/m?  ?Wt Readings from Last 3 Encounters:  ?06/09/21 123 lb 4 oz (55.9 kg)  ?05/09/21 124 lb 4 oz (56.4 kg)  ?03/05/21 126 lb (57.2 kg)  ? ? ?General: Well-appearing ?Eyes: sclera anicteric, no redness ?ENT: oral mucosa moist without lesions, no cervical or supraclavicular lymphadenopathy ?CV: RRR without murmur, S1/S2, no JVD, no peripheral edema ?Resp: clear to auscultation bilaterally, normal RR and effort noted ?GI: soft, no tenderness, with active bowel sounds. No guarding or palpable organomegaly noted. ?Skin; warm and dry, no rash or jaundice noted ?Neuro: awake, alert and oriented x 3. Normal gross motor function and fluent speech ?Perianal exam normal, mildly decreased resting sphincter tone, no  palpable internal lesions or tenderness, no fissure.  No stool in rectal vault. ?Labs: ? ?CBC Latest Ref Rng & Units 05/09/2021  ?WBC 4.0 - 10.5 K/uL 4.8  ?Hemoglobin 12.0 - 15.0 g/dL 12.8  ?Hematocrit 36.0 - 46.0 % 38.2  ?Platelets 150.0 - 400.0 K/uL 269.0  ? ? ? ?Assessment: ?Encounter Diagnosis  ?Name Primary?  ? Rectal bleeding Yes  ? ? ?Most likely hemorrhoidal, though with reported history must rule out proctitis/colitis. ? ?Plan: ? ?Colonoscopy soon.  She was agreeable after discussion of procedure and risks. ? The benefits and risks of the planned procedure were described in detail with the patient or (when appropriate) their health care proxy.  Risks were outlined as including, but not limited to, bleeding, infection, perforation, adverse medication reaction leading to cardiac or pulmonary decompensation, pancreatitis (if ERCP).  The  limitation of incomplete mucosal visualization was also discussed.  No guarantees or warranties were given. ? ?Banding brochure given for her to consider in the event that this turns out to be hemorrhoidal bleeding. ? ?Thank you for the courtesy of this consult.  Please call me with any questions or concerns. ? ?Nelida Meuse III ? ?CC: Referring provider noted above ? ?

## 2021-06-09 NOTE — Patient Instructions (Signed)
If you are age 68 or older, your body mass index should be between 23-30. Your Body mass index is 21.16 kg/m?Marland Kitchen If this is out of the aforementioned range listed, please consider follow up with your Primary Care Provider. ? ?If you are age 58 or younger, your body mass index should be between 19-25. Your Body mass index is 21.16 kg/m?Marland Kitchen If this is out of the aformentioned range listed, please consider follow up with your Primary Care Provider.  ? ?________________________________________________________ ? ?The [facility] providers would like to encourage you to use [facility] to communicate with providers for non-urgent requests or questions.  Due to long hold times on the telephone, sending your provider a message by [facility] may be a faster and more efficient way to get a response.  Please allow 48 business hours for a response.  Please remember that this is for non-urgent requests.  ?_______________________________________________________ ? ?You have been scheduled for a colonoscopy. Please follow written instructions given to you at your visit today.  ?Please pick up your prep supplies at the pharmacy within the next 1-3 days. ?If you use inhalers (even only as needed), please bring them with you on the day of your procedure. ? ?Due to recent changes in healthcare laws, you may see the results of your imaging and laboratory studies on MyChart before your provider has had a chance to review them.  We understand that in some cases there may be results that are confusing or concerning to you. Not all laboratory results come back in the same time frame and the provider may be waiting for multiple results in order to interpret others.  Please give [place] 48 hours in order for your provider to thoroughly review all the results before contacting the office for clarification of your results.  ? ?It was a pleasure to see you today! ? ?Thank you for trusting me with your gastrointestinal care!   ? ? ?

## 2021-06-30 DIAGNOSIS — M65312 Trigger thumb, left thumb: Secondary | ICD-10-CM | POA: Diagnosis not present

## 2021-07-04 ENCOUNTER — Encounter: Payer: Self-pay | Admitting: Gastroenterology

## 2021-07-07 DIAGNOSIS — Z79890 Hormone replacement therapy (postmenopausal): Secondary | ICD-10-CM | POA: Diagnosis not present

## 2021-07-07 DIAGNOSIS — E785 Hyperlipidemia, unspecified: Secondary | ICD-10-CM | POA: Diagnosis not present

## 2021-07-07 DIAGNOSIS — Z008 Encounter for other general examination: Secondary | ICD-10-CM | POA: Diagnosis not present

## 2021-07-07 DIAGNOSIS — H3530 Unspecified macular degeneration: Secondary | ICD-10-CM | POA: Diagnosis not present

## 2021-07-07 DIAGNOSIS — Z8052 Family history of malignant neoplasm of bladder: Secondary | ICD-10-CM | POA: Diagnosis not present

## 2021-07-07 DIAGNOSIS — Z882 Allergy status to sulfonamides status: Secondary | ICD-10-CM | POA: Diagnosis not present

## 2021-07-07 DIAGNOSIS — R030 Elevated blood-pressure reading, without diagnosis of hypertension: Secondary | ICD-10-CM | POA: Diagnosis not present

## 2021-07-07 DIAGNOSIS — N951 Menopausal and female climacteric states: Secondary | ICD-10-CM | POA: Diagnosis not present

## 2021-07-07 DIAGNOSIS — Z8249 Family history of ischemic heart disease and other diseases of the circulatory system: Secondary | ICD-10-CM | POA: Diagnosis not present

## 2021-07-18 ENCOUNTER — Ambulatory Visit (AMBULATORY_SURGERY_CENTER): Payer: Medicare HMO | Admitting: Gastroenterology

## 2021-07-18 ENCOUNTER — Encounter: Payer: Self-pay | Admitting: Gastroenterology

## 2021-07-18 VITALS — BP 103/65 | HR 65 | Temp 98.9°F | Resp 21 | Ht 64.0 in | Wt 123.0 lb

## 2021-07-18 DIAGNOSIS — K625 Hemorrhage of anus and rectum: Secondary | ICD-10-CM

## 2021-07-18 DIAGNOSIS — K648 Other hemorrhoids: Secondary | ICD-10-CM

## 2021-07-18 DIAGNOSIS — D122 Benign neoplasm of ascending colon: Secondary | ICD-10-CM | POA: Diagnosis not present

## 2021-07-18 MED ORDER — SODIUM CHLORIDE 0.9 % IV SOLN: 500.0000 mL | Freq: Once | INTRAVENOUS | Status: DC

## 2021-07-18 NOTE — Progress Notes (Signed)
History and Physical: ? This patient presents for endoscopic testing for: ?Encounter Diagnosis  ?Name Primary?  ? Rectal bleeding Yes  ? ? ?Clinical details in office consult note dated 06/09/2021. ?About a year of intermittent painless rectal bleeding. ? ?Patient is otherwise without complaints or active issues today. ? ? ?Past Medical History: ?Past Medical History:  ?Diagnosis Date  ? GERD (gastroesophageal reflux disease)   ? History of colon polyps   ? Macular degeneration   ? Rosacea   ? ? ? ?Past Surgical History: ?Past Surgical History:  ?Procedure Laterality Date  ? ABDOMINAL HYSTERECTOMY  1997  ? AUGMENTATION MAMMAPLASTY Bilateral 2009  ? Silicone  ? BREAST BIOPSY Bilateral   ? multiple, all benign   ? BREAST EXCISIONAL BIOPSY Left   ? BREAST EXCISIONAL BIOPSY Left   ? BREAST EXCISIONAL BIOPSY Right   ? BREAST SURGERY    ? CESAREAN SECTION    ? COLONOSCOPY    ? ? ?Allergies: ?Allergies  ?Allergen Reactions  ? Sulfa Antibiotics Other (See Comments)  ?  Mouth sores  ? ? ?Outpatient Meds: ?Current Outpatient Medications  ?Medication Sig Dispense Refill  ? Calcium Citrate-Vitamin D (CALCIUM + D PO) Take 1 capsule by mouth daily.    ? cholecalciferol (VITAMIN D3) 25 MCG (1000 UNIT) tablet Take 1,000 Units by mouth daily.    ? Multiple Vitamins-Minerals (PRESERVISION AREDS 2+MULTI VIT PO) Take 2 tablets by mouth daily.    ? vitamin E 200 UNIT capsule Take 200 Units by mouth daily.    ? estradiol (ESTRACE VAGINAL) 0.1 MG/GM vaginal cream Insert 1 gram vaginally for 2 weeks followed by 3 times per week. 42.5 g 10  ? ?Current Facility-Administered Medications  ?Medication Dose Route Frequency Provider Last Rate Last Admin  ? 0.9 %  sodium chloride infusion  500 mL Intravenous Once Doran Stabler, MD      ? ? ? ? ?___________________________________________________________________ ?Objective  ? ?Exam: ? ?BP 116/89   Pulse 74   Temp 98.9 ?F (37.2 ?C)   Ht '5\' 4"'$  (1.626 m)   Wt 123 lb (55.8 kg)   SpO2 98%    BMI 21.11 kg/m?  ? ?CV: RRR without murmur, S1/S2 ?Resp: clear to auscultation bilaterally, normal RR and effort noted ?GI: soft, no tenderness, with active bowel sounds. ? ? ?Assessment: ?Encounter Diagnosis  ?Name Primary?  ? Rectal bleeding Yes  ? ? ? ?Plan: ?Colonoscopy ? ? The benefits and risks of the planned procedure were described in detail with the patient or (when appropriate) their health care proxy.  Risks were outlined as including, but not limited to, bleeding, infection, perforation, adverse medication reaction leading to cardiac or pulmonary decompensation, pancreatitis (if ERCP).  The limitation of incomplete mucosal visualization was also discussed.  No guarantees or warranties were given. ? ? ? ?The patient is appropriate for an endoscopic procedure in the ambulatory setting. ? ? - Wilfrid Lund, MD ? ? ? ? ?

## 2021-07-18 NOTE — Progress Notes (Signed)
Called to room to assist during endoscopic procedure.  Patient ID and intended procedure confirmed with present staff. Received instructions for my participation in the procedure from the performing physician.  

## 2021-07-18 NOTE — Progress Notes (Signed)
PT taken to PACU. Monitors in place. VSS. Report given to RN. 

## 2021-07-18 NOTE — Patient Instructions (Signed)
Resume previous diet and medications. Awaiting pathology results. Repeat Colonoscopy date to be determined based on pathology results.  ?Take Citrucel fiber one tablespoon in glass of water daily for what sounds like mild IBS with benign anal bleeding. ? ?YOU HAD AN ENDOSCOPIC PROCEDURE TODAY AT [facility]:   Refer to the procedure report that was given to you for any specific questions about what was found during the examination.  If the procedure report does not answer your questions, please call your gastroenterologist to clarify.  If you requested that your care partner not be given the details of your procedure findings, then the procedure report has been included in a sealed envelope for you to review at your convenience later. ? ?YOU SHOULD EXPECT: Some feelings of bloating in the abdomen. Passage of more gas than usual.  Walking can help get rid of the air that was put into your GI tract during the procedure and reduce the bloating. If you had a lower endoscopy (such as a colonoscopy or flexible sigmoidoscopy) you may notice spotting of blood in your stool or on the toilet paper. If you underwent a bowel prep for your procedure, you may not have a normal bowel movement for a few days. ? ?Please Note:  You might notice some irritation and congestion in your nose or some drainage.  This is from the oxygen used during your procedure.  There is no need for concern and it should clear up in a day or so. ? ?SYMPTOMS TO REPORT IMMEDIATELY: ? ?Following lower endoscopy (colonoscopy or flexible sigmoidoscopy): ? Excessive amounts of blood in the stool ? Significant tenderness or worsening of abdominal pains ? Swelling of the abdomen that is new, acute ? Fever of 100?F or higher ? ?For urgent or emergent issues, a gastroenterologist can be reached at any hour by calling [phone]. ?Do not use MyChart messaging for urgent concerns.  ? ? ?DIET:  We do recommend a small meal at first, but then you  may proceed to your regular diet.  Drink plenty of fluids but you should avoid alcoholic beverages for 24 hours. ? ?ACTIVITY:  You should plan to take it easy for the rest of today and you should NOT DRIVE or use heavy machinery until tomorrow (because of the sedation medicines used during the test).   ? ?FOLLOW UP: ?Our staff will call the number listed on your records 48-72 hours following your procedure to check on you and address any questions or concerns that you may have regarding the information given to you following your procedure. If we do not reach you, we will leave a message.  We will attempt to reach you two times.  During this call, we will ask if you have developed any symptoms of COVID 19. If you develop any symptoms (ie: fever, flu-like symptoms, shortness of breath, cough etc.) before then, please call [phone].  If you test positive for Covid 19 in the 2 weeks post procedure, please call and report this information to [place].   ? ?If any biopsies were taken you will be contacted by phone or by letter within the next 1-3 weeks.  Please call us at [phone] if you have not heard about the biopsies in 3 weeks.  ? ? ?SIGNATURES/CONFIDENTIALITY: ?You and/or your care partner have signed paperwork which will be entered into your electronic medical record.  These signatures attest to the fact that that the information above on your After Visit Summary has been  reviewed and is understood.  Full responsibility of the confidentiality of this discharge information lies with you and/or your care-partner.  ?

## 2021-07-18 NOTE — Op Note (Addendum)
[place] ?Patient Name: Sarah Flowers ?Procedure Date: 07/18/2021 1:14 PM ?MRN: [id number] ?Endoscopist: Estill Cotta. Loletha Carrow , MD ?Age: 66 ?Referring MD:  ?Date of Birth: Sep 04, 1955 ?Gender: Female ?Account #: [id number] ?Procedure:                Colonoscopy ?Indications:              Rectal bleeding, episodic fecal  ?                          urgency/incontinence and feeling incompletely  ?                          evacuated. ?Medicines:                Monitored Anesthesia Care ?Procedure:                Pre-Anesthesia Assessment: ?                          - Prior to the procedure, a History and Physical  ?                          was performed, and patient medications and  ?                          allergies were reviewed. The patient's tolerance of  ?                          previous anesthesia was also reviewed. The risks  ?                          and benefits of the procedure and the sedation  ?                          options and risks were discussed with the patient.  ?                          All questions were answered, and informed consent  ?                          was obtained. Prior Anticoagulants: The patient has  ?                          taken no previous anticoagulant or antiplatelet  ?                          agents. ASA Grade Assessment: II - A patient with  ?                          mild systemic disease. After reviewing the risks  ?                          and benefits, the patient was deemed in  ?  satisfactory condition to undergo the procedure. ?                          After obtaining informed consent, the colonoscope  ?                          was passed under direct vision. Throughout the  ?                          procedure, the patient's blood pressure, pulse, and  ?                          oxygen saturations were monitored continuously. The  ?                          Olympus PCF-H190DL ([id number]) Colonoscope was  ?                           introduced through the anus and advanced to the the  ?                          cecum, identified by appendiceal orifice and  ?                          ileocecal valve. The colonoscopy was performed with  ?                          difficulty due to a redundant colon and a tortuous  ?                          colon. Successful completion of the procedure was  ?                          aided by using manual pressure, straightening and  ?                          shortening the scope to obtain bowel loop reduction  ?                          and lavage. The patient tolerated the procedure  ?                          well. The quality of the bowel preparation was  ?                          excellent. The ileocecal valve, appendiceal  ?                          orifice, and rectum were photographed. ?Scope In: 1:24:26 PM ?Scope Out: 1:44:53 PM ?Scope Withdrawal Time: 0 hours 9 minutes 23 seconds  ?Total Procedure Duration: 0 hours 20 minutes 27 seconds  ?Findings:                 The perianal and digital rectal examinations were  ?  normal. ?                          The sigmoid colon was tortuous and redundant. ?                          A 6 mm polyp was found in the ascending colon. The  ?                          polyp was semi-sessile. The polyp was removed with  ?                          a cold snare. Resection and retrieval were complete. ?                          A few small-mouthed diverticula were found in the  ?                          distal sigmoid colon. ?                          Small external hemorrhoids were found within the  ?                          anal canal (below the dentate line). ?                          The exam was otherwise without abnormality on  ?                          direct and retroflexion views. No mucosal evidence  ?                          of chronic prolapse. ?Complications:            No immediate complications. ?Estimated Blood Loss:      Estimated blood loss was minimal. ?Impression:               - Redundant colon. ?                          - One 6 mm polyp in the ascending colon, removed  ?                          with a cold snare. Resected and retrieved. ?                          - Diverticulosis in the distal sigmoid colon. ?                          - External hemorrhoids. ?                          - The examination was otherwise normal on direct  ?  and retroflexion views. No internal hemorrhoids  ?                          were seen. ?Recommendation:           - Patient has a contact number available for  ?                          emergencies. The signs and symptoms of potential  ?                          delayed complications were discussed with the  ?                          patient. Return to normal activities tomorrow.  ?                          Written discharge instructions were provided to the  ?                          patient. ?                          - Resume previous diet. ?                          - Continue present medications. ?                          - Await pathology results. ?                          - Repeat colonoscopy is recommended for  ?                          surveillance. The colonoscopy date will be  ?                          determined after pathology results from today's  ?                          exam become available for review. ?                          - Citrucel fiber - one tablespoon in glass of water  ?                          daily for what sounds like mild IBS with benign  ?                          anal bleeding. ?                          Ongoing symptoms would require colorectal surgery  ?                          evaluation. ?                          -  Return to my office at appointment to be  ?                          scheduled. ?Tascha Casares L. Loletha Carrow, MD ?07/18/2021 1:56:31 PM ?This report has been signed electronically. ?

## 2021-07-18 NOTE — Progress Notes (Signed)
VS by CW  Pt's states no medical or surgical changes since previsit or office visit.  

## 2021-07-21 ENCOUNTER — Telehealth: Payer: Self-pay

## 2021-07-21 NOTE — Telephone Encounter (Signed)
Per 07/18/21 procedure report - Return to my office at appt to be scheduled ? ?Left pt a detailed vm asking her to call the office at her convenience to schedule a follow up with Dr. Loletha Carrow. ?

## 2021-07-22 ENCOUNTER — Telehealth: Payer: Self-pay | Admitting: *Deleted

## 2021-07-22 NOTE — Telephone Encounter (Signed)
?  Follow up Call- ? ? ?  07/18/2021  ?  1:04 PM  ?Call back number  ?Post procedure Call Back phone  # [phone]  ?Permission to leave phone message Yes  ?  ? ?Patient questions: ? ?Do you have a fever, pain , or abdominal swelling? No. ?Pain Score  0 * ? ?Have you tolerated food without any problems? Yes.   ? ?Have you been able to return to your normal activities? Yes.   ? ?Do you have any questions about your discharge instructions: ?Diet   No. ?Medications  No. ?Follow up visit  No. ? ?Do you have questions or concerns about your Care? No. ? ?Actions: ?* If pain score is 4 or above: ?No action needed, pain <4. ? ? ?

## 2021-07-22 NOTE — Telephone Encounter (Signed)
Lm on vm for patient to return call to set up appt with Dr. Loletha Carrow. ?

## 2021-07-23 NOTE — Telephone Encounter (Signed)
Patient returned your phone call.  She asked if you could call her sooner rather than later as she is about to go into work.  Thank you. ?

## 2021-07-23 NOTE — Telephone Encounter (Signed)
Returned call to patient. Pt has been advised that she needs to be scheduled for a f/u with Dr. Loletha Carrow. Pt has been scheduled for Friday, 09/05/21 at 2 pm. Pt verbalized understanding and had no concerns at the end of the call. ?

## 2021-07-28 ENCOUNTER — Encounter: Payer: Self-pay | Admitting: Gastroenterology

## 2021-08-15 DIAGNOSIS — H2513 Age-related nuclear cataract, bilateral: Secondary | ICD-10-CM | POA: Diagnosis not present

## 2021-08-15 DIAGNOSIS — H5203 Hypermetropia, bilateral: Secondary | ICD-10-CM | POA: Diagnosis not present

## 2021-08-16 DIAGNOSIS — Z0100 Encounter for examination of eyes and vision without abnormal findings: Secondary | ICD-10-CM | POA: Diagnosis not present

## 2021-09-05 ENCOUNTER — Ambulatory Visit: Payer: Medicare HMO | Admitting: Gastroenterology

## 2021-09-05 ENCOUNTER — Encounter: Payer: Self-pay | Admitting: Gastroenterology

## 2021-09-05 VITALS — BP 120/60 | HR 66 | Ht 64.5 in | Wt 124.0 lb

## 2021-09-05 DIAGNOSIS — R194 Change in bowel habit: Secondary | ICD-10-CM | POA: Diagnosis not present

## 2021-09-05 DIAGNOSIS — K625 Hemorrhage of anus and rectum: Secondary | ICD-10-CM | POA: Diagnosis not present

## 2021-09-05 NOTE — Progress Notes (Signed)
     [facility] Progress Note  Chief Complaint: Rectal bleeding  Subjective  History: Seen in the office March 20 for painless rectal bleeding. Colonoscopy 07/18/2021 with these findings: "The perianal and digital rectal examinations were normal. - The sigmoid colon was tortuous and redundant. - A 6 mm polyp was found in the ascending colon. The polyp was semi-sessile. The polyp was removed with a cold snare. Resection and retrieval were complete. - A few small-mouthed diverticula were found in the distal sigmoid colon. - Small external hemorrhoids were found within the anal canal (below the dentate line). - The exam was otherwise without abnormality on direct and retroflexion views. No mucosal evidence of chronic prolapse."  Polyp was a tubular adenoma  Daily fiber supplement recommended to help regulate BMs for what sounded like mild IBS with feelings of incomplete evacuation and urgency with BMs __________________________________  Haynes Dage is doing well since I last saw her.  On a teaspoon or 2 of "Optifiber" daily her bowel habits have regulated and there is no further bleeding.  She denies abdominal pain nausea or vomiting.  ROS: Cardiovascular:  no chest pain Respiratory: no dyspnea  The patient's Past Medical, Family and Social History were reviewed and are on file in the EMR.  Objective:  Med list reviewed  Current Outpatient Medications:    Calcium Citrate-Vitamin D (CALCIUM + D PO), Take 1 capsule by mouth daily., Disp: , Rfl:    cholecalciferol (VITAMIN D3) 25 MCG (1000 UNIT) tablet, Take 1,000 Units by mouth daily., Disp: , Rfl:    estradiol (ESTRACE VAGINAL) 0.1 MG/GM vaginal cream, Insert 1 gram vaginally for 2 weeks followed by 3 times per week., Disp: 42.5 g, Rfl: 10   Multiple Vitamins-Minerals (PRESERVISION AREDS 2+MULTI VIT PO), Take 2 tablets by mouth daily., Disp: , Rfl:    OVER THE COUNTER MEDICATION, Opti fiber: one teaspoon daily, Disp: , Rfl:     vitamin E 200 UNIT capsule, Take 200 Units by mouth daily., Disp: , Rfl:    Vital signs in last 24 hrs: Vitals:   09/05/21 1356  BP: 120/60  Pulse: 66   Wt Readings from Last 3 Encounters:  09/05/21 124 lb (56.2 kg)  07/18/21 123 lb (55.8 kg)  06/09/21 123 lb 4 oz (55.9 kg)    Physical Exam No exam -entire visit spent in record review and discussion Labs: Polyp pathology as noted above, tubular adenoma without high-grade dysplasia  ___________________________________________ Radiologic studies:   ____________________________________________ Other:   _____________________________________________ Assessment & Plan  Assessment: Encounter Diagnoses  Name Primary?   Altered bowel habits Yes   Rectal bleeding     Mild IBS-like bowel habits normalized on low-dose fiber supplement, bleeding has ceased with that.  Plan: 7-year colonoscopy recall set See me as needed    Nelida Meuse III

## 2021-09-05 NOTE — Patient Instructions (Signed)
If you are age 68 or older, your body mass index should be between 23-30. Your Body mass index is 20.96 kg/m. If this is out of the aforementioned range listed, please consider follow up with your Primary Care Provider.  If you are age 53 or younger, your body mass index should be between 19-25. Your Body mass index is 20.96 kg/m. If this is out of the aformentioned range listed, please consider follow up with your Primary Care Provider.   ________________________________________________________  The [facility] providers would like to encourage you to use [facility] to communicate with providers for non-urgent requests or questions.  Due to long hold times on the telephone, sending your provider a message by [facility] may be a faster and more efficient way to get a response.  Please allow 48 business hours for a response.  Please remember that this is for non-urgent requests.  _______________________________________________________  It was a pleasure to see you today!  Thank you for trusting me with your gastrointestinal care!

## 2021-09-09 ENCOUNTER — Telehealth: Payer: Self-pay | Admitting: General Practice

## 2021-09-09 NOTE — Telephone Encounter (Signed)
Pt due for annual exam with Dr. Eugenie Norrie after 12/04/2021.  Left a message on VM for pt to contact our office to schedule.

## 2021-09-15 ENCOUNTER — Encounter: Payer: Medicare HMO | Admitting: Family Medicine

## 2021-09-22 ENCOUNTER — Ambulatory Visit (INDEPENDENT_AMBULATORY_CARE_PROVIDER_SITE_OTHER): Payer: Medicare HMO

## 2021-09-22 ENCOUNTER — Encounter: Payer: Self-pay | Admitting: Family Medicine

## 2021-09-22 ENCOUNTER — Ambulatory Visit (INDEPENDENT_AMBULATORY_CARE_PROVIDER_SITE_OTHER): Payer: Medicare HMO | Admitting: Family Medicine

## 2021-09-22 VITALS — BP 120/80 | HR 75 | Temp 98.0°F | Ht 64.5 in | Wt 122.4 lb

## 2021-09-22 DIAGNOSIS — Z780 Asymptomatic menopausal state: Secondary | ICD-10-CM

## 2021-09-22 DIAGNOSIS — Z0000 Encounter for general adult medical examination without abnormal findings: Secondary | ICD-10-CM | POA: Diagnosis not present

## 2021-09-22 DIAGNOSIS — E7800 Pure hypercholesterolemia, unspecified: Secondary | ICD-10-CM | POA: Diagnosis not present

## 2021-09-22 LAB — COMPREHENSIVE METABOLIC PANEL
ALT: 23 U/L (ref 0–35)
AST: 26 U/L (ref 0–37)
Albumin: 4.6 g/dL (ref 3.5–5.2)
Alkaline Phosphatase: 54 U/L (ref 39–117)
BUN: 16 mg/dL (ref 6–23)
CO2: 29 mEq/L (ref 19–32)
Calcium: 9.7 mg/dL (ref 8.4–10.5)
Chloride: 101 mEq/L (ref 96–112)
Creatinine, Ser: 0.97 mg/dL (ref 0.40–1.20)
GFR: 60.25 mL/min (ref >60.00)
Glucose, Bld: 96 mg/dL (ref 70–99)
Potassium: 4.4 mEq/L (ref 3.5–5.1)
Sodium: 139 mEq/L (ref 135–145)
Total Bilirubin: 0.7 mg/dL (ref 0.2–1.2)
Total Protein: 6.6 g/dL (ref 6.0–8.3)

## 2021-09-22 LAB — LIPID PANEL
Cholesterol: 220 mg/dL — ABNORMAL HIGH (ref 0–200)
HDL: 76.3 mg/dL (ref >39.00)
LDL Cholesterol: 116 mg/dL — ABNORMAL HIGH (ref 0–99)
NonHDL: 143.47
Total CHOL/HDL Ratio: 3
Triglycerides: 138 mg/dL (ref 0.0–149.0)
VLDL: 27.6 mg/dL (ref 0.0–40.0)

## 2021-09-22 NOTE — Patient Instructions (Signed)
Sarah Flowers , Thank you for taking time to come for your Medicare Wellness Visit. I appreciate your ongoing commitment to your health goals. Please review the following plan we discussed and let me know if I can assist you in the future.   Screening recommendations/referrals: Colonoscopy: 07/18/21 due 07/18/28 Mammogram: 04/21/21 due 04/21/22 Bone Density: ordered 09/22/21 Recommended yearly ophthalmology/optometry visit for glaucoma screening and checkup Recommended yearly dental visit for hygiene and checkup  Vaccinations: Influenza vaccine: up to date Pneumococcal vaccine: up to date Tdap vaccine: up to date Shingles vaccine: up to date   Covid-19:completed  Advanced directives: yes, not on file  Conditions/risks identified: see problem list   Next appointment: Follow up in one year for your annual wellness visit 09/28/22   Preventive Care 68 Years and Older, Female Preventive care refers to lifestyle choices and visits with your health care provider that can promote health and wellness. What does preventive care include? A yearly physical exam. This is also called an annual well check. Dental exams once or twice a year. Routine eye exams. Ask your health care provider how often you should have your eyes checked. Personal lifestyle choices, including: Daily care of your teeth and gums. Regular physical activity. Eating a healthy diet. Avoiding tobacco and drug use. Limiting alcohol use. Practicing safe sex. Taking low-dose aspirin every day. Taking vitamin and mineral supplements as recommended by your health care provider. What happens during an annual well check? The services and screenings done by your health care provider during your annual well check will depend on your age, overall health, lifestyle risk factors, and family history of disease. Counseling  Your health care provider may ask you questions about your: Alcohol use. Tobacco use. Drug use. Emotional  well-being. Home and relationship well-being. Sexual activity. Eating habits. History of falls. Memory and ability to understand (cognition). Work and work Statistician. Reproductive health. Screening  You may have the following tests or measurements: Height, weight, and BMI. Blood pressure. Lipid and cholesterol levels. These may be checked every 5 years, or more frequently if you are over 4 years old. Skin check. Lung cancer screening. You may have this screening every year starting at age 52 if you have a 30-pack-year history of smoking and currently smoke or have quit within the past 15 years. Fecal occult blood test (FOBT) of the stool. You may have this test every year starting at age 63. Flexible sigmoidoscopy or colonoscopy. You may have a sigmoidoscopy every 5 years or a colonoscopy every 10 years starting at age 31. Hepatitis C blood test. Hepatitis B blood test. Sexually transmitted disease (STD) testing. Diabetes screening. This is done by checking your blood sugar (glucose) after you have not eaten for a while (fasting). You may have this done every 1-3 years. Bone density scan. This is done to screen for osteoporosis. You may have this done starting at age 56. Mammogram. This may be done every 1-2 years. Talk to your health care provider about how often you should have regular mammograms. Talk with your health care provider about your test results, treatment options, and if necessary, the need for more tests. Vaccines  Your health care provider may recommend certain vaccines, such as: Influenza vaccine. This is recommended every year. Tetanus, diphtheria, and acellular pertussis (Tdap, Td) vaccine. You may need a Td booster every 10 years. Zoster vaccine. You may need this after age 6. Pneumococcal 13-valent conjugate (PCV13) vaccine. One dose is recommended after age 40. Pneumococcal polysaccharide (PPSV23) vaccine.  One dose is recommended after age 25. Talk to your  health care provider about which screenings and vaccines you need and how often you need them. This information is not intended to replace advice given to you by your health care provider. Make sure you discuss any questions you have with your health care provider. Document Released: 04/05/2015 Document Revised: 11/27/2015 Document Reviewed: 01/08/2015 Elsevier Interactive Patient Education  2017 [place] Prevention in the Home Falls can cause injuries. They can happen to people of all ages. There are many things you can do to make your home safe and to help prevent falls. What can I do on the outside of my home? Regularly fix the edges of walkways and driveways and fix any cracks. Remove anything that might make you trip as you walk through a door, such as a raised step or threshold. Trim any bushes or trees on the path to your home. Use bright outdoor lighting. Clear any walking paths of anything that might make someone trip, such as rocks or tools. Regularly check to see if handrails are loose or broken. Make sure that both sides of any steps have handrails. Any raised decks and porches should have guardrails on the edges. Have any leaves, snow, or ice cleared regularly. Use sand or salt on walking paths during winter. Clean up any spills in your garage right away. This includes oil or grease spills. What can I do in the bathroom? Use night lights. Install grab bars by the toilet and in the tub and shower. Do not use towel bars as grab bars. Use non-skid mats or decals in the tub or shower. If you need to sit down in the shower, use a plastic, non-slip stool. Keep the floor dry. Clean up any water that spills on the floor as soon as it happens. Remove soap buildup in the tub or shower regularly. Attach bath mats securely with double-sided non-slip rug tape. Do not have throw rugs and other things on the floor that can make you trip. What can I do in the bedroom? Use night  lights. Make sure that you have a light by your bed that is easy to reach. Do not use any sheets or blankets that are too big for your bed. They should not hang down onto the floor. Have a firm chair that has side arms. You can use this for support while you get dressed. Do not have throw rugs and other things on the floor that can make you trip. What can I do in the kitchen? Clean up any spills right away. Avoid walking on wet floors. Keep items that you use a lot in easy-to-reach places. If you need to reach something above you, use a strong step stool that has a grab bar. Keep electrical cords out of the way. Do not use floor polish or wax that makes floors slippery. If you must use wax, use non-skid floor wax. Do not have throw rugs and other things on the floor that can make you trip. What can I do with my stairs? Do not leave any items on the stairs. Make sure that there are handrails on both sides of the stairs and use them. Fix handrails that are broken or loose. Make sure that handrails are as long as the stairways. Check any carpeting to make sure that it is firmly attached to the stairs. Fix any carpet that is loose or worn. Avoid having throw rugs at the top or bottom of  the stairs. If you do have throw rugs, attach them to the floor with carpet tape. Make sure that you have a light switch at the top of the stairs and the bottom of the stairs. If you do not have them, ask someone to add them for you. What else can I do to help prevent falls? Wear shoes that: Do not have high heels. Have rubber bottoms. Are comfortable and fit you well. Are closed at the toe. Do not wear sandals. If you use a stepladder: Make sure that it is fully opened. Do not climb a closed stepladder. Make sure that both sides of the stepladder are locked into place. Ask someone to hold it for you, if possible. Clearly mark and make sure that you can see: Any grab bars or handrails. First and last  steps. Where the edge of each step is. Use tools that help you move around (mobility aids) if they are needed. These include: Canes. Walkers. Scooters. Crutches. Turn on the lights when you go into a dark area. Replace any light bulbs as soon as they burn out. Set up your furniture so you have a clear path. Avoid moving your furniture around. If any of your floors are uneven, fix them. If there are any pets around you, be aware of where they are. Review your medicines with your doctor. Some medicines can make you feel dizzy. This can increase your chance of falling. Ask your doctor what other things that you can do to help prevent falls. This information is not intended to replace advice given to you by your health care provider. Make sure you discuss any questions you have with your health care provider. Document Released: 01/03/2009 Document Revised: 08/15/2015 Document Reviewed: 04/13/2014 Elsevier Interactive Patient Education  2017 [employer].

## 2021-09-22 NOTE — Patient Instructions (Addendum)
Give [place] 2-3 business days to get the results of your labs back.   Keep the diet clean and stay active. Consider adding weight resistance exercise to your regimen.   Please get me a copy of your advanced directive form at your convenience.   Let us know if you need anything.

## 2021-09-22 NOTE — Progress Notes (Signed)
Chief Complaint  Patient presents with   Annual Exam     Well Woman Sarah Flowers is here for a complete physical.   Her last physical was >1 year ago.  Current diet: in general, a "healthy" diet. Current exercise: running. Weight is stable and she denies daytime fatigue. Seatbelt? Yes Advanced directive? Yes  Health Maintenance Colonoscopy- Yes Shingrix- Yes DEXA- Yes Mammogram- Yes Tetanus- Yes Pneumonia- Yes Hep C screen- Yes  Past Medical History:  Diagnosis Date   GERD (gastroesophageal reflux disease)    History of colon polyps    Macular degeneration    Rosacea      Past Surgical History:  Procedure Laterality Date   ABDOMINAL HYSTERECTOMY  1997   AUGMENTATION MAMMAPLASTY Bilateral 1914   Silicone   BREAST BIOPSY Bilateral    multiple, all benign    BREAST EXCISIONAL BIOPSY Left    BREAST EXCISIONAL BIOPSY Left    BREAST EXCISIONAL BIOPSY Right    BREAST SURGERY     CESAREAN SECTION     COLONOSCOPY      Medications  Current Outpatient Medications on File Prior to Visit  Medication Sig Dispense Refill   Calcium Citrate-Vitamin D (CALCIUM + D PO) Take 1 capsule by mouth daily.     cholecalciferol (VITAMIN D3) 25 MCG (1000 UNIT) tablet Take 1,000 Units by mouth daily.     estradiol (ESTRACE VAGINAL) 0.1 MG/GM vaginal cream Insert 1 gram vaginally for 2 weeks followed by 3 times per week. 42.5 g 10   Multiple Vitamins-Minerals (PRESERVISION AREDS 2+MULTI VIT PO) Take 2 tablets by mouth daily.     OVER THE COUNTER MEDICATION Opti fiber: one teaspoon daily     vitamin E 200 UNIT capsule Take 200 Units by mouth daily.      Allergies Allergies  Allergen Reactions   Sulfa Antibiotics Other (See Comments)    Mouth sores    Review of Systems: Constitutional:  no fevers Eye:  no recent significant change in vision Ears:  No changes in hearing Nose/Mouth/Throat:  no complaints of nasal congestion, no sore throat Cardiovascular: no chest  pain Respiratory:  No shortness of breath Gastrointestinal:  No change in bowel habits GU:  Female: negative for dysuria Integumentary:  no abnormal skin lesions reported Neurologic:  no headaches Endocrine:  denies unexplained weight changes  Exam BP 120/80   Pulse 75   Temp 98 F (36.7 C) (Oral)   Ht 5' 4.5" (1.638 m)   Wt 122 lb 6 oz (55.5 kg)   SpO2 99%   BMI 20.68 kg/m  General:  well developed, well nourished, in no apparent distress Skin:  no significant moles, warts, or growths Head:  no masses, lesions, or tenderness Eyes:  pupils equal and round, sclera anicteric without injection Ears:  canals without lesions, TMs shiny without retraction, no obvious effusion, no erythema Nose:  nares patent, septum midline, mucosa normal, and no drainage or sinus tenderness Throat/Pharynx:  lips and gingiva without lesion; tongue and uvula midline; non-inflamed pharynx; no exudates or postnasal drainage Neck: neck supple without adenopathy, thyromegaly, or masses Lungs:  clear to auscultation, breath sounds equal bilaterally, no respiratory distress Cardio:  regular rate and rhythm, no bruits or LE edema Abdomen:  abdomen soft, nontender; bowel sounds normal; no masses or organomegaly Genital: Deferred Neuro:  gait normal; deep tendon reflexes normal and symmetric Psych: well oriented with normal range of affect and appropriate judgment/insight  Assessment and Plan  Well adult exam  Pure  hypercholesterolemia - Plan: Comprehensive metabolic panel, Lipid panel   Well 66 y.o. female. Counseled on diet and exercise. Other orders as above. Advanced directive form requested today.  Follow up in 1 yr or prn. The patient voiced understanding and agreement to the plan.  [place], DO 09/22/21 9:14 AM

## 2021-09-22 NOTE — Progress Notes (Signed)
Subjective:   Sarah Flowers is a 66 y.o. female who presents for Medicare Annual (Subsequent) preventive examination.  I connected with  Sarah Flowers on 09/22/21 by a audio enabled telemedicine application and verified that I am speaking with the correct person using two identifiers.  Patient Location: Home  Provider Location: Office/Clinic  I discussed the limitations of evaluation and management by telemedicine. The patient expressed understanding and agreed to proceed.   Review of Systems     Cardiac Risk Factors include: advanced age (>34mn, >>89women)     Objective:    There were no vitals filed for this visit. There is no height or weight on file to calculate BMI.     09/22/2021   10:22 AM 09/16/2020   10:23 AM  Advanced Directives  Does Patient Have a Medical Advance Directive? Yes Yes  Type of AParamedicof AOsageOut of facility DNR (pink MOST or yellow form);Living will HWilderLiving will  Copy of [place] Chart? No - copy requested No - copy requested    Current Medications (verified) Outpatient Encounter Medications as of 09/22/2021  Medication Sig   Calcium Citrate-Vitamin D (CALCIUM + D PO) Take 1 capsule by mouth daily.   cholecalciferol (VITAMIN D3) 25 MCG (1000 UNIT) tablet Take 1,000 Units by mouth daily.   estradiol (ESTRACE VAGINAL) 0.1 MG/GM vaginal cream Insert 1 gram vaginally for 2 weeks followed by 3 times per week.   Multiple Vitamins-Minerals (PRESERVISION AREDS 2+MULTI VIT PO) Take 2 tablets by mouth daily.   OVER THE COUNTER MEDICATION Opti fiber: one teaspoon daily   vitamin E 200 UNIT capsule Take 200 Units by mouth daily.   No facility-administered encounter medications on file as of 09/22/2021.    Allergies (verified) Sulfa antibiotics   History: Past Medical History:  Diagnosis Date   GERD (gastroesophageal reflux disease)    History of colon polyps     Macular degeneration    Rosacea    Past Surgical History:  Procedure Laterality Date   ABDOMINAL HYSTERECTOMY  1997   AUGMENTATION MAMMAPLASTY Bilateral 24132  Silicone   BREAST BIOPSY Bilateral    multiple, all benign    BREAST EXCISIONAL BIOPSY Left    BREAST EXCISIONAL BIOPSY Left    BREAST EXCISIONAL BIOPSY Right    BREAST SURGERY     CESAREAN SECTION     COLONOSCOPY     Family History  Problem Relation Age of Onset   Multiple sclerosis Mother    Colon polyps Father    Heart disease Father    Breast cancer Paternal Aunt    Colon cancer Neg Hx    Esophageal cancer Neg Hx    Pancreatic cancer Neg Hx    Stomach cancer Neg Hx    Rectal cancer Neg Hx    Social History   Socioeconomic History   Marital status: Married    Spouse name: Not on file   Number of children: 3   Years of education: Not on file   Highest education level: Not on file  Occupational History   Occupation: RProgrammer, multimedia [employer]  Tobacco Use   Smoking status: Never   Smokeless tobacco: Never  Vaping Use   Vaping Use: Never used  Substance and Sexual Activity   Alcohol use: Not Currently   Drug use: Never   Sexual activity: Yes  Other Topics Concern   Not on file  Social  History Narrative   Not on file   Social Determinants of Health   Financial Resource Strain: Low Risk  (09/16/2020)   Overall Financial Resource Strain (CARDIA)    Difficulty of Paying Living Expenses: Not hard at all  Food Insecurity: No Food Insecurity (09/16/2020)   Hunger Vital Sign    Worried About Running Out of Food in the Last Year: Never true    Ran Out of Food in the Last Year: Never true  Transportation Needs: No Transportation Needs (09/16/2020)   PRAPARE - Hydrologist (Medical): No    Lack of Transportation (Non-Medical): No  Physical Activity: Sufficiently Active (09/16/2020)   Exercise Vital Sign    Days of Exercise per Week: 7 days    Minutes of Exercise per Session:  60 min  Stress: No Stress Concern Present (09/16/2020)   [place]    Feeling of Stress : Not at all  Social Connections: Moderately Integrated (09/16/2020)   Social Connection and Isolation Panel [NHANES]    Frequency of Communication with Friends and Family: More than three times a week    Frequency of Social Gatherings with Friends and Family: More than three times a week    Attends Religious Services: Never    Marine scientist or Organizations: Yes    Attends Music therapist: More than 4 times per year    Marital Status: Married    Tobacco Counseling Counseling given: Not Answered   Clinical Intake:  Pre-visit preparation completed: Yes  Pain : No/denies pain     BMI - recorded: 20.68 Nutritional Status: BMI of 19-24  Normal Nutritional Risks: None Diabetes: No  How often do you need to have someone help you when you read instructions, pamphlets, or other written materials from your doctor or pharmacy?: 1 - Never  Diabetic?no  Interpreter Needed?: No  Information entered by :: Levada Bowersox   Activities of Daily Living    09/22/2021   10:24 AM  In your present state of health, do you have any difficulty performing the following activities:  Hearing? 0  Vision? 0  Difficulty concentrating or making decisions? 0  Walking or climbing stairs? 0  Dressing or bathing? 0  Doing errands, shopping? 0  Preparing Food and eating ? N  Using the Toilet? N  In the past six months, have you accidently leaked urine? N  Do you have problems with loss of bowel control? N  Managing your Medications? N  Managing your Finances? N  Housekeeping or managing your Housekeeping? N    Patient Care Team: Shelda Pal, DO as PCP - General (Family Medicine)  Indicate any recent Medical Services you may have received from other than [facility] providers in the past year (date may be  approximate).     Assessment:   This is a routine wellness examination for [name].  Hearing/Vision screen No results found.  Dietary issues and exercise activities discussed: Current Exercise Habits: Home exercise routine, Type of exercise: walking;Other - see comments (running), Time (Minutes): 60, Frequency (Times/Week): 7, Weekly Exercise (Minutes/Week): 420, Exercise limited by: None identified   Goals Addressed   None    Depression Screen    09/22/2021   10:23 AM 09/22/2021    8:56 AM 09/16/2020   10:25 AM 09/13/2020   12:51 PM 09/11/2019   12:49 PM  PHQ 2/9 Scores  PHQ - 2 Score 0 0 0 0 0  PHQ- 9 Score  0       Fall Risk    09/22/2021   10:22 AM 09/22/2021    8:56 AM 09/16/2020   10:24 AM 09/13/2020   12:50 PM 09/11/2019   12:49 PM  Fall Risk   Falls in the past year? 0 0 1 1 0  Number falls in past yr: 0 0 1 0   Comment    has had a fall while running   Injury with Fall? 0 0 1 1   Risk for fall due to : No Fall Risks No Fall Risks History of fall(s) No Fall Risks   Follow up Falls evaluation completed Falls evaluation completed Falls prevention discussed Falls evaluation completed     [place]:  Any stairs in or around the home? Yes  If so, are there any without handrails? No  Home free of loose throw rugs in walkways, pet beds, electrical cords, etc? Yes  Adequate lighting in your home to reduce risk of falls? Yes   ASSISTIVE DEVICES UTILIZED TO PREVENT FALLS:  Life alert? No  Use of a cane, walker or w/c? No  Grab bars in the bathroom? No  Shower chair or bench in shower? No  Elevated toilet seat or a handicapped toilet? No   TIMED UP AND GO:  Was the test performed? No .    Cognitive Function:        09/22/2021   10:28 AM  6CIT Screen  What Year? 0 points  What month? 0 points  What time? 0 points  Count back from 20 0 points  Months in reverse 0 points  Repeat phrase 0 points  Total Score 0 points     Immunizations Immunization History  Administered Date(s) Administered   Influenza-Unspecified 12/13/2019, 12/21/2020   PFIZER(Purple Top)SARS-COV-2 Vaccination 04/27/2019, 05/22/2019, 01/11/2020, 07/03/2020   Pfizer Covid-19 Vaccine Bivalent Booster 22yr & up 12/21/2020   Pneumococcal Conjugate-13 11/16/2018   Pneumococcal Polysaccharide-23 01/13/2020   Tdap 06/01/2018   Zoster Recombinat (Shingrix) 10/25/2020, 02/07/2021    TDAP status: Up to date  Flu Vaccine status: Up to date  Pneumococcal vaccine status: Up to date  Covid-19 vaccine status: Completed vaccines  Qualifies for Shingles Vaccine? Yes   Zostavax completed No   Shingrix Completed?: Yes  Screening Tests Health Maintenance  Topic Date Due   COVID-19 Vaccine (6 - Pfizer series) 03/25/2022 (Originally 04/23/2021)   INFLUENZA VACCINE  10/21/2021   MAMMOGRAM  04/22/2023   TETANUS/TDAP  05/31/2028   COLONOSCOPY (Pts 45-428yrInsurance coverage will need to be confirmed)  07/18/2028   Pneumonia Vaccine 6527Years old  Completed   DEXA SCAN  Completed   Hepatitis C Screening  Completed   Zoster Vaccines- Shingrix  Completed   HPV VACCINES  Aged Out    Health Maintenance  There are no preventive care reminders to display for this patient.  Colorectal cancer screening: Type of screening: Colonoscopy. Completed 07/18/21. Repeat every 7 years  Mammogram status: Completed 04/21/21. Repeat every year  Bone Density status: Ordered 09/22/21. Pt provided with contact info and advised to call to schedule appt.  Lung Cancer Screening: (Low Dose CT Chest recommended if Age 68-80ears, 30 pack-year currently smoking OR have quit w/in 15years.) does not qualify.   Lung Cancer Screening Referral: N/A  Additional Screening:  Hepatitis C Screening: does qualify; Completed 06/01/18  Vision Screening: Recommended annual ophthalmology exams for early detection of glaucoma and other disorders of the  eye. Is the patient up  to date with their annual eye exam?  Yes  Who is the provider or what is the name of the office in which the patient attends annual eye exams? Dr. Gilmer Mor If pt is not established with a provider, would they like to be referred to a provider to establish care? No .   Dental Screening: Recommended annual dental exams for proper oral hygiene  Community Resource Referral / Chronic Care Management: CRR required this visit?  No   CCM required this visit?  No      Plan:     I have personally reviewed and noted the following in the patient's chart:   Medical and social history Use of alcohol, tobacco or illicit drugs  Current medications and supplements including opioid prescriptions.  Functional ability and status Nutritional status Physical activity Advanced directives List of other physicians Hospitalizations, surgeries, and ER visits in previous 12 months Vitals Screenings to include cognitive, depression, and falls Referrals and appointments  In addition, I have reviewed and discussed with patient certain preventive protocols, quality metrics, and best practice recommendations. A written personalized care plan for preventive services as well as general preventive health recommendations were provided to patient.   Due to this being a telephonic visit, the after visit summary with patients personalized plan was offered to patient via mail or my-chart.  Patient would like to access on my-chart   Nicholaus Corolla, [place]   09/22/2021   Nurse Notes: dexa scan

## 2021-09-25 ENCOUNTER — Telehealth (HOSPITAL_BASED_OUTPATIENT_CLINIC_OR_DEPARTMENT_OTHER): Payer: Self-pay

## 2021-10-06 DIAGNOSIS — G8918 Other acute postprocedural pain: Secondary | ICD-10-CM | POA: Diagnosis not present

## 2021-10-06 DIAGNOSIS — M65312 Trigger thumb, left thumb: Secondary | ICD-10-CM | POA: Diagnosis not present

## 2021-10-20 DIAGNOSIS — H0011 Chalazion right upper eyelid: Secondary | ICD-10-CM | POA: Diagnosis not present

## 2021-10-24 DIAGNOSIS — H0011 Chalazion right upper eyelid: Secondary | ICD-10-CM | POA: Diagnosis not present

## 2021-11-28 ENCOUNTER — Telehealth: Payer: Self-pay | Admitting: General Practice

## 2021-11-28 NOTE — Telephone Encounter (Signed)
Left message on VM informing pt that her appt on 12/10/2021 will need to be rescheduled d/t provider will be on medical leave.  Asked pt to contact our office to reschedule.

## 2021-12-10 ENCOUNTER — Ambulatory Visit: Payer: Medicare HMO | Admitting: Obstetrics & Gynecology

## 2022-02-09 ENCOUNTER — Other Ambulatory Visit (HOSPITAL_BASED_OUTPATIENT_CLINIC_OR_DEPARTMENT_OTHER): Payer: Medicare HMO

## 2022-02-16 ENCOUNTER — Ambulatory Visit (HOSPITAL_BASED_OUTPATIENT_CLINIC_OR_DEPARTMENT_OTHER)
Admission: RE | Admit: 2022-02-16 | Discharge: 2022-02-16 | Disposition: A | Discharge status: Home / Self Care | Payer: Medicare HMO | Source: Ambulatory Visit | Attending: Family Medicine | Admitting: Family Medicine

## 2022-02-16 DIAGNOSIS — M8588 Other specified disorders of bone density and structure, other site: Secondary | ICD-10-CM | POA: Diagnosis not present

## 2022-02-16 DIAGNOSIS — H43393 Other vitreous opacities, bilateral: Secondary | ICD-10-CM | POA: Diagnosis not present

## 2022-02-16 DIAGNOSIS — H353132 Nonexudative age-related macular degeneration, bilateral, intermediate dry stage: Secondary | ICD-10-CM | POA: Diagnosis not present

## 2022-02-16 DIAGNOSIS — H43813 Vitreous degeneration, bilateral: Secondary | ICD-10-CM | POA: Diagnosis not present

## 2022-02-16 DIAGNOSIS — Z780 Asymptomatic menopausal state: Secondary | ICD-10-CM | POA: Diagnosis not present

## 2022-02-16 DIAGNOSIS — H3540 Unspecified peripheral retinal degeneration: Secondary | ICD-10-CM | POA: Diagnosis not present

## 2022-03-06 DIAGNOSIS — Z90711 Acquired absence of uterus with remaining cervical stump: Secondary | ICD-10-CM | POA: Diagnosis not present

## 2022-03-06 DIAGNOSIS — Z01419 Encounter for gynecological examination (general) (routine) without abnormal findings: Secondary | ICD-10-CM | POA: Diagnosis not present

## 2022-06-04 DIAGNOSIS — H00012 Hordeolum externum right lower eyelid: Secondary | ICD-10-CM | POA: Diagnosis not present

## 2022-07-20 DIAGNOSIS — M25641 Stiffness of right hand, not elsewhere classified: Secondary | ICD-10-CM | POA: Diagnosis not present

## 2022-07-20 DIAGNOSIS — M79641 Pain in right hand: Secondary | ICD-10-CM | POA: Diagnosis not present

## 2022-07-31 IMAGING — MG DIGITAL SCREENING BREAST BILAT IMPLANT W/ TOMO W/ CAD
9 of 12 series · 9 of 28 positions shown · non-contrast
Comparison: Previous exam(s).

CLINICAL DATA: Screening.

EXAM:
DIGITAL SCREENING BILATERAL MAMMOGRAM WITH IMPLANTS, CAD AND
TOMOSYNTHESIS
TECHNIQUE: Bilateral screening digital craniocaudal and mediolateral oblique
mammograms were obtained. Bilateral screening digital breast
tomosynthesis was performed. The images were evaluated with
computer-aided detection. Standard and/or implant displaced views
were performed.

[R MLO]
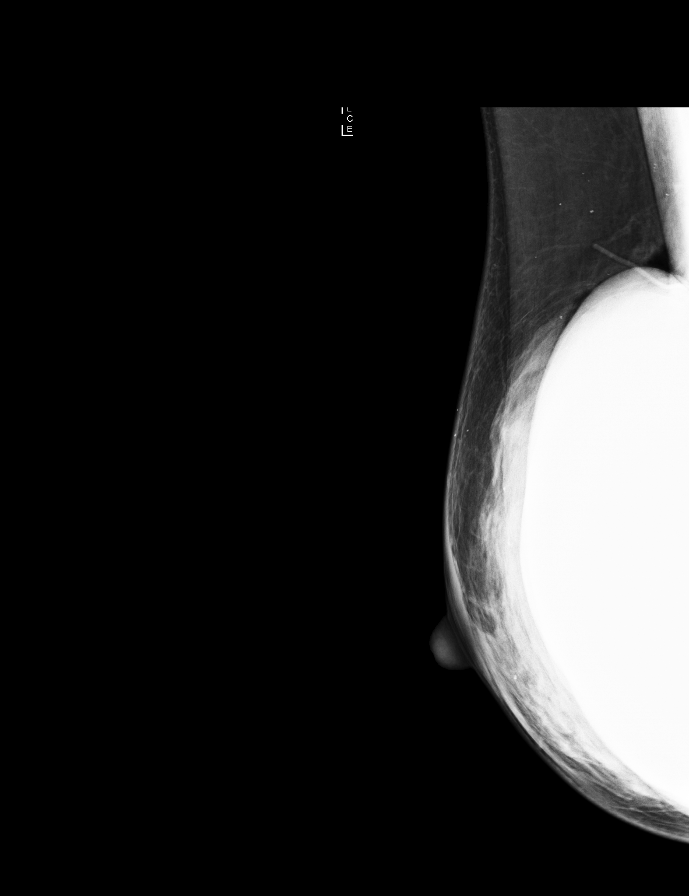

[R CC]
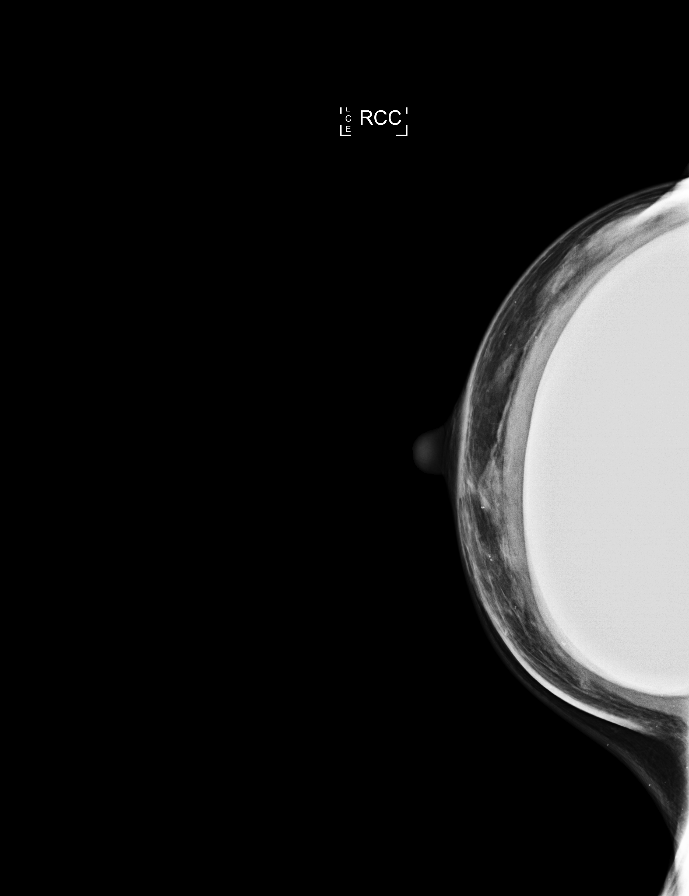

[L CC]
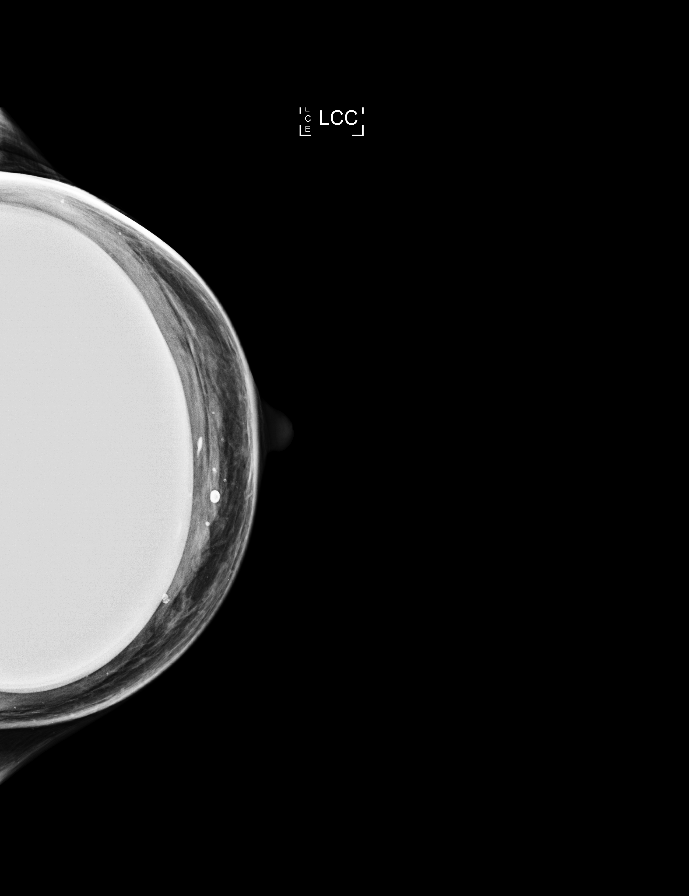

[L MLO]
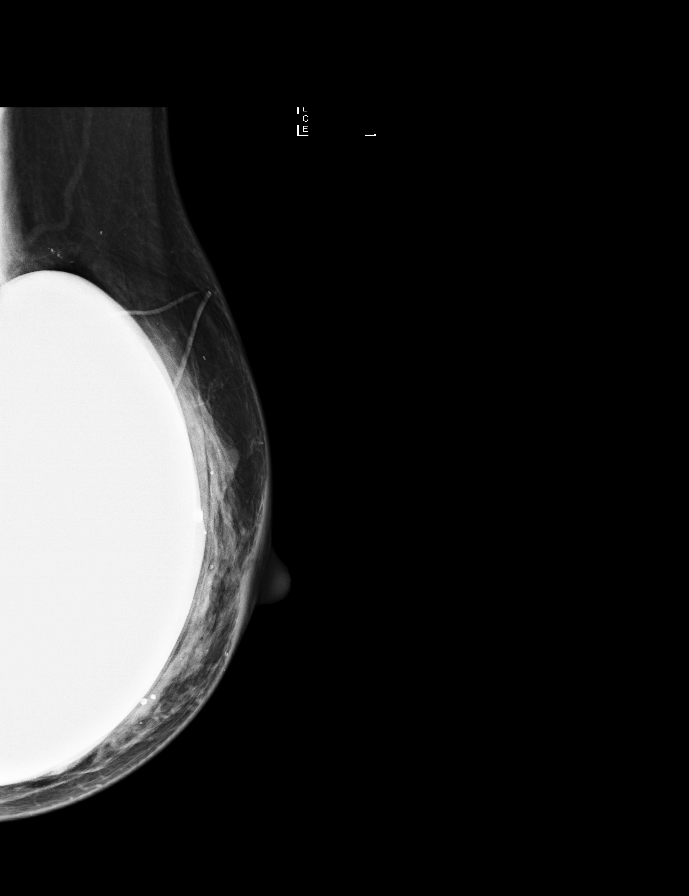

[L MLO synth-2D]
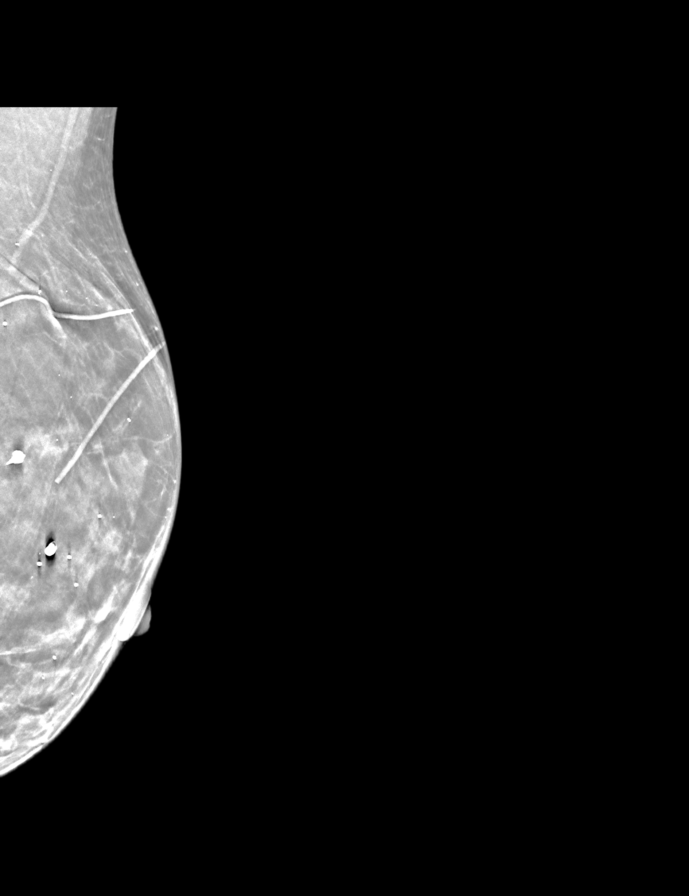

[R MLO synth-2D]
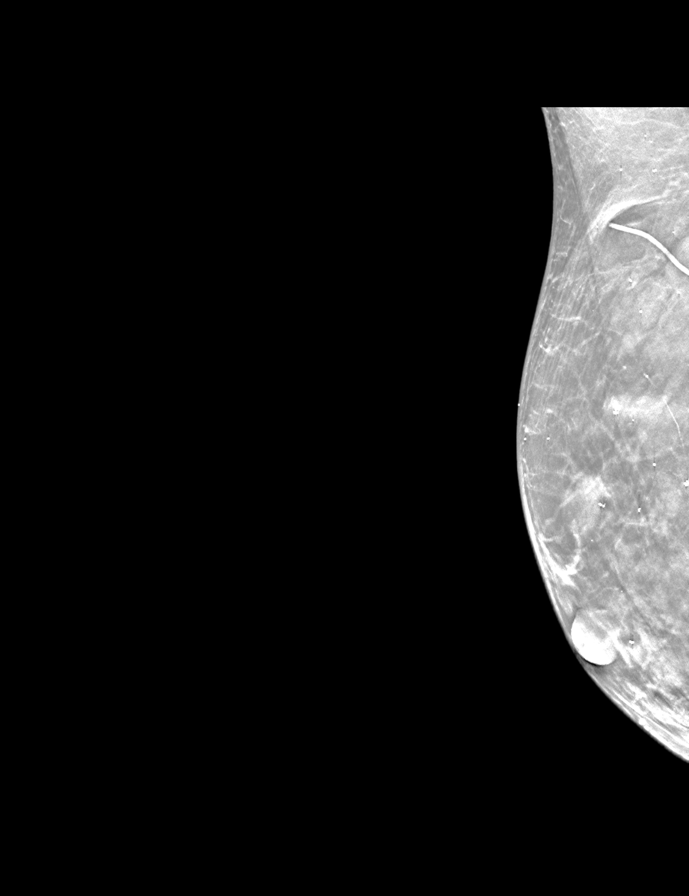

[R CC synth-2D]
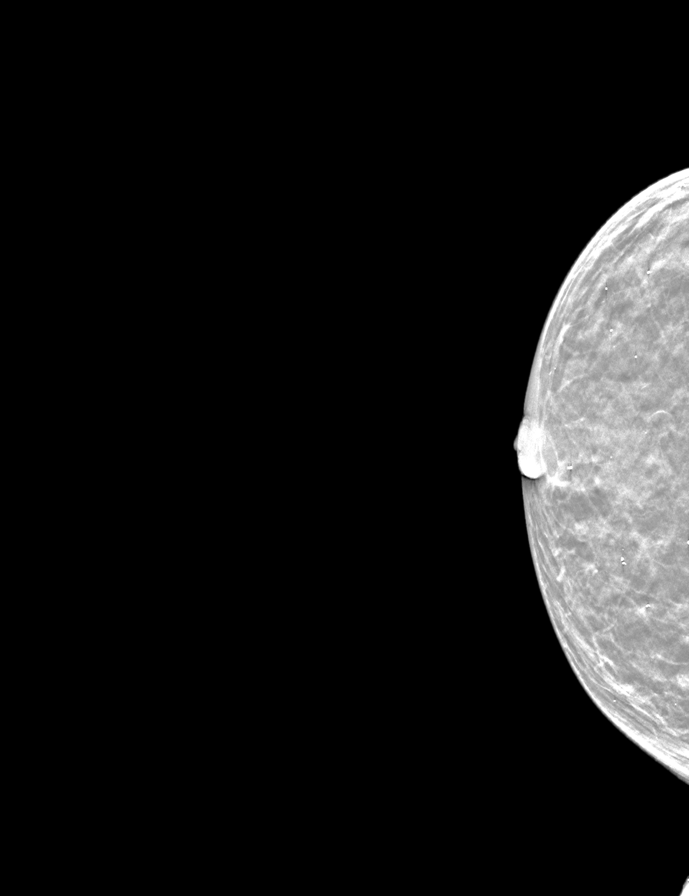

[L CC synth-2D]
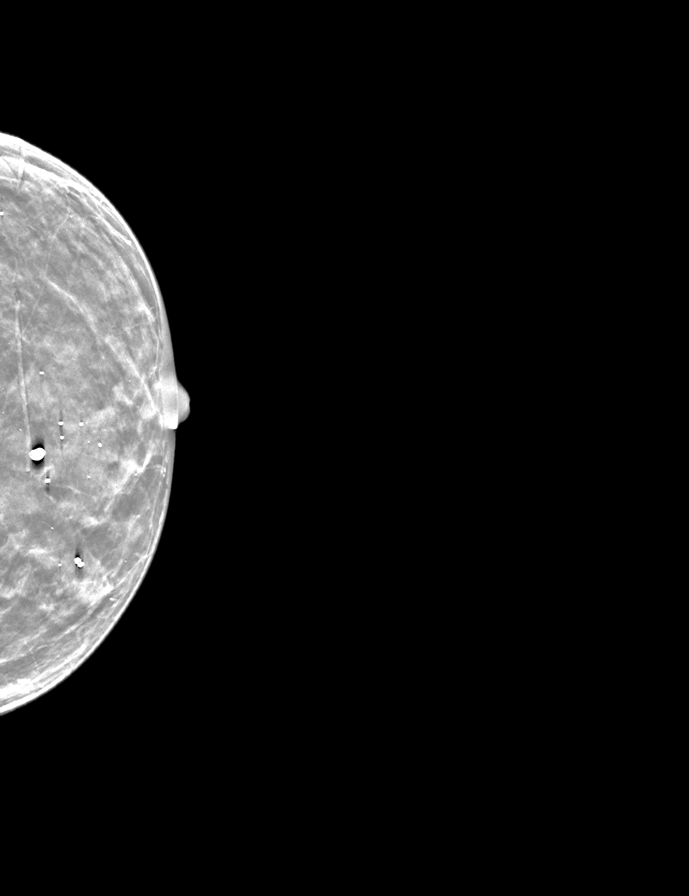

[L CCID BREAST TOMOSYNTHESIS IMAGE tomo · tomo slice 15/28.0]
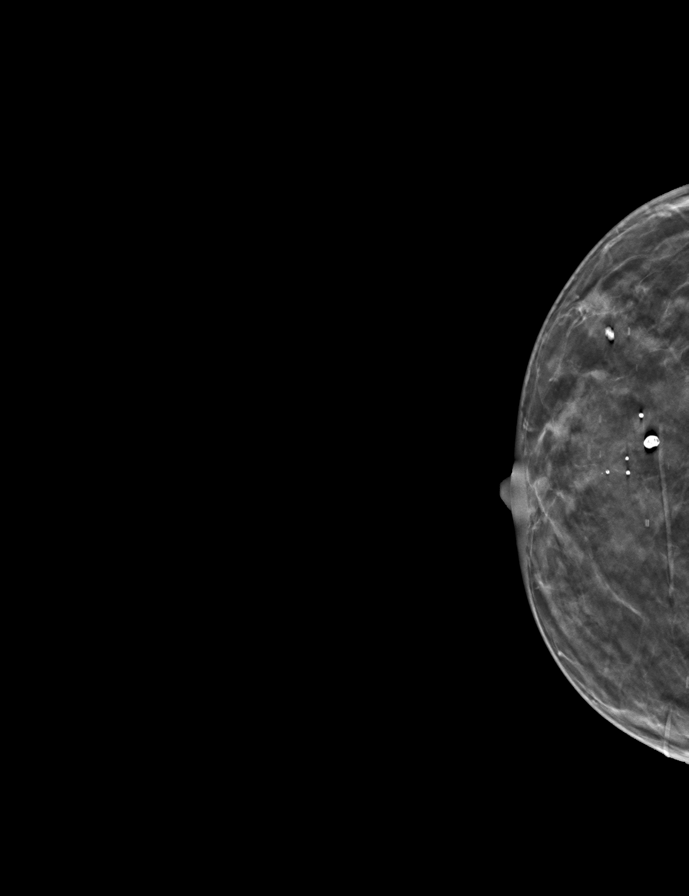

[9 of 28 positions shown; findings below may reference images not displayed]

ACR Breast Density Category c: The breast tissue is heterogeneously
dense, which may obscure small masses.
FINDINGS: The patient has retropectoral implants. There are no findings
suspicious for malignancy.
IMPRESSION: No mammographic evidence of malignancy. A result letter of this
screening mammogram will be mailed directly to the patient.

RECOMMENDATION:
Screening mammogram in one year. (Code:LT-E-7TH)

BI-RADS CATEGORY  1:  Negative.

## 2022-08-25 DIAGNOSIS — L814 Other melanin hyperpigmentation: Secondary | ICD-10-CM | POA: Diagnosis not present

## 2022-08-25 DIAGNOSIS — D1801 Hemangioma of skin and subcutaneous tissue: Secondary | ICD-10-CM | POA: Diagnosis not present

## 2022-08-25 DIAGNOSIS — Z411 Encounter for cosmetic surgery: Secondary | ICD-10-CM | POA: Diagnosis not present

## 2022-08-25 DIAGNOSIS — L578 Other skin changes due to chronic exposure to nonionizing radiation: Secondary | ICD-10-CM | POA: Diagnosis not present

## 2022-08-25 DIAGNOSIS — D225 Melanocytic nevi of trunk: Secondary | ICD-10-CM | POA: Diagnosis not present

## 2022-08-25 DIAGNOSIS — L820 Inflamed seborrheic keratosis: Secondary | ICD-10-CM | POA: Diagnosis not present

## 2022-08-25 DIAGNOSIS — D485 Neoplasm of uncertain behavior of skin: Secondary | ICD-10-CM | POA: Diagnosis not present

## 2022-08-25 DIAGNOSIS — L821 Other seborrheic keratosis: Secondary | ICD-10-CM | POA: Diagnosis not present

## 2022-08-25 DIAGNOSIS — D229 Melanocytic nevi, unspecified: Secondary | ICD-10-CM | POA: Diagnosis not present

## 2022-09-28 ENCOUNTER — Encounter: Payer: Self-pay | Admitting: Family Medicine

## 2022-09-28 ENCOUNTER — Ambulatory Visit (INDEPENDENT_AMBULATORY_CARE_PROVIDER_SITE_OTHER): Payer: Medicare HMO | Admitting: *Deleted

## 2022-09-28 ENCOUNTER — Ambulatory Visit: Payer: Medicare HMO | Admitting: Family Medicine

## 2022-09-28 VITALS — BP 108/62 | HR 78 | Temp 97.8°F | Ht 64.0 in | Wt 126.0 lb

## 2022-09-28 VITALS — BP 108/62 | HR 78 | Ht 64.0 in | Wt 126.0 lb

## 2022-09-28 DIAGNOSIS — Z0000 Encounter for general adult medical examination without abnormal findings: Secondary | ICD-10-CM

## 2022-09-28 DIAGNOSIS — E7800 Pure hypercholesterolemia, unspecified: Secondary | ICD-10-CM

## 2022-09-28 LAB — COMPREHENSIVE METABOLIC PANEL
ALT: 26 U/L (ref 0–35)
AST: 30 U/L (ref 0–37)
Albumin: 4.6 g/dL (ref 3.5–5.2)
Alkaline Phosphatase: 56 U/L (ref 39–117)
BUN: 18 mg/dL (ref 6–23)
CO2: 28 mEq/L (ref 19–32)
Calcium: 10.4 mg/dL (ref 8.4–10.5)
Chloride: 101 mEq/L (ref 96–112)
Creatinine, Ser: 0.99 mg/dL (ref 0.40–1.20)
GFR: 58.37 mL/min — ABNORMAL LOW (ref >60.00)
Glucose, Bld: 94 mg/dL (ref 70–99)
Potassium: 4.1 mEq/L (ref 3.5–5.1)
Sodium: 139 mEq/L (ref 135–145)
Total Bilirubin: 0.7 mg/dL (ref 0.2–1.2)
Total Protein: 7 g/dL (ref 6.0–8.3)

## 2022-09-28 LAB — LIPID PANEL
Cholesterol: 220 mg/dL — ABNORMAL HIGH (ref 0–200)
HDL: 68.4 mg/dL (ref >39.00)
LDL Cholesterol: 124 mg/dL — ABNORMAL HIGH (ref 0–99)
NonHDL: 151.36
Total CHOL/HDL Ratio: 3
Triglycerides: 138 mg/dL (ref 0.0–149.0)
VLDL: 27.6 mg/dL (ref 0.0–40.0)

## 2022-09-28 NOTE — Patient Instructions (Signed)
Give us 2-3 business days to get the results of your labs back.   Keep the diet clean and stay active.  Please consider adding some weight resistance exercise to your routine. Consider yoga as well.   Please get me a copy of your advanced directive form at your convenience.   Let us know if you need anything.  

## 2022-09-28 NOTE — Progress Notes (Signed)
Chief Complaint  Patient presents with   Annual Exam     Well Woman Sarah Flowers is here for a complete physical.   Her last physical was >1 year ago.  Current diet: in general, a "healthy" diet. Current exercise: yoga, running. Weight is stable and she denies daytime fatigue. Seatbelt? Yes Advanced directive? Yes  Health Maintenance Colonoscopy- Yes Shingrix- Yes DEXA- Yes Mammogram- Yes Tetanus- Yes Pneumonia- Yes Hep C screen- Yes  Past Medical History:  Diagnosis Date   Allergy 2013   Sulfa   Arthritis 06/2022   Both Hands; rheumatoid lab done; per Dr Yehuda Budd   Blood transfusion without reported diagnosis 1991   Placental abruption   GERD (gastroesophageal reflux disease)    History of colon polyps    Hyperlipidemia 2023   Macular degeneration    Rosacea      Past Surgical History:  Procedure Laterality Date   ABDOMINAL HYSTERECTOMY  1997   AUGMENTATION MAMMAPLASTY Bilateral 2009   Silicone   BREAST BIOPSY Bilateral    multiple, all benign    BREAST EXCISIONAL BIOPSY Left    BREAST EXCISIONAL BIOPSY Left    BREAST EXCISIONAL BIOPSY Right    BREAST SURGERY     CESAREAN SECTION     COLONOSCOPY     COSMETIC SURGERY  2009   Augmentation mammoplasty   TUBAL LIGATION  1987   Reversal 1991    Medications  Current Outpatient Medications on File Prior to Visit  Medication Sig Dispense Refill   Calcium Citrate-Vitamin D (CALCIUM + D PO) Take 1 capsule by mouth daily.     cholecalciferol (VITAMIN D3) 25 MCG (1000 UNIT) tablet Take 1,000 Units by mouth daily.     estradiol (ESTRACE VAGINAL) 0.1 MG/GM vaginal cream Insert 1 gram vaginally for 2 weeks followed by 3 times per week. 42.5 g 10   Multiple Vitamins-Minerals (PRESERVISION AREDS 2+MULTI VIT PO) Take 2 tablets by mouth daily.     Omega-3 Fatty Acids (FISH OIL OMEGA-3 PO) Take 1 capsule by mouth daily.     OVER THE COUNTER MEDICATION Opti fiber: one teaspoon daily     No current  facility-administered medications on file prior to visit.     Allergies Allergies  Allergen Reactions   Sulfa Antibiotics Other (See Comments)    Mouth sores    Review of Systems: Constitutional:  no fevers Eye:  no recent significant change in vision Ears:  No changes in hearing Nose/Mouth/Throat:  no complaints of nasal congestion, no sore throat Cardiovascular: no chest pain Respiratory:  No shortness of breath Gastrointestinal:  No change in bowel habits GU:  Female: negative for dysuria Integumentary:  no abnormal skin lesions reported Neurologic:  no headaches Endocrine:  denies unexplained weight changes  Exam BP 108/62 (BP Location: Left Arm, Patient Position: Sitting, Cuff Size: Normal)   Pulse 78   Temp 97.8 F (36.6 C) (Oral)   Ht 5\' 4"  (1.626 m)   Wt 126 lb (57.2 kg)   SpO2 98%   BMI 21.63 kg/m  General:  well developed, well nourished, in no apparent distress Skin:  no significant moles, warts, or growths Head:  no masses, lesions, or tenderness Eyes:  pupils equal and round, sclera anicteric without injection Ears:  canals without lesions, TMs shiny without retraction, no obvious effusion, no erythema Nose:  nares patent, mucosa normal, and no drainage Throat/Pharynx:  lips and gingiva without lesion; tongue and uvula midline; non-inflamed pharynx; no exudates or postnasal drainage Neck:  neck supple without adenopathy, thyromegaly, or masses Lungs:  clear to auscultation, breath sounds equal bilaterally, no respiratory distress Cardio:  regular rate and rhythm, no bruits or LE edema Abdomen:  abdomen soft, nontender; bowel sounds normal; no masses or organomegaly Genital: Deferred Neuro:  gait normal; deep tendon reflexes normal and symmetric Psych: well oriented with normal range of affect and appropriate judgment/insight  Assessment and Plan  Well adult exam  Pure hypercholesterolemia - Plan: Comprehensive metabolic panel, Lipid panel   Well 67  y.o. female. Counseled on diet and exercise. Advanced directive form requested today.  Other orders as above. Follow up in 1 yr. The patient voiced understanding and agreement to the plan.  Jilda Roche Tonsina, DO 09/28/22 9:28 AM

## 2022-09-28 NOTE — Progress Notes (Signed)
Subjective:   Sarah Flowers is a 67 y.o. female who presents for Medicare Annual (Subsequent) preventive examination.  Visit Complete: In person   Review of Systems     Cardiac Risk Factors include: advanced age (>9men, >15 women);dyslipidemia     Objective:    Today's Vitals   09/28/22 0937  BP: 108/62  Pulse: 78  Weight: 126 lb (57.2 kg)  Height: 5\' 4"  (1.626 m)   Body mass index is 21.63 kg/m.     09/28/2022    9:26 AM 09/22/2021   10:22 AM 09/16/2020   10:23 AM  Advanced Directives  Does Patient Have a Medical Advance Directive? Yes Yes Yes  Type of Estate agent of [place];Living will Healthcare Power of [place];Out of facility DNR (pink MOST or yellow form);Living will Healthcare Power of [place];Living will  Does patient want to make changes to medical advance directive? No - Patient declined    Copy of Healthcare Power of Attorney in Chart? No - copy requested No - copy requested No - copy requested    Current Medications (verified) Outpatient Encounter Medications as of 09/28/2022  Medication Sig   Calcium Citrate-Vitamin D (CALCIUM + D PO) Take 1 capsule by mouth daily.   cholecalciferol (VITAMIN D3) 25 MCG (1000 UNIT) tablet Take 1,000 Units by mouth daily.   estradiol (ESTRACE VAGINAL) 0.1 MG/GM vaginal cream Insert 1 gram vaginally for 2 weeks followed by 3 times per week.   Multiple Vitamins-Minerals (PRESERVISION AREDS 2+MULTI VIT PO) Take 2 tablets by mouth daily.   OVER THE COUNTER MEDICATION Opti fiber: one teaspoon daily   No facility-administered encounter medications on file as of 09/28/2022.    Allergies (verified) Sulfa antibiotics   History: Past Medical History:  Diagnosis Date   Allergy 2013   Sulfa   Arthritis 06/2022   Both Hands; rheumatoid lab done; per Dr Yehuda Budd   Blood transfusion without reported diagnosis 1991   Placental abruption   GERD (gastroesophageal reflux disease)    History of colon polyps     Hyperlipidemia 2023   Macular degeneration    Rosacea    Past Surgical History:  Procedure Laterality Date   ABDOMINAL HYSTERECTOMY  1997   AUGMENTATION MAMMAPLASTY Bilateral 2009   Silicone   BREAST BIOPSY Bilateral    multiple, all benign    BREAST EXCISIONAL BIOPSY Left    BREAST EXCISIONAL BIOPSY Left    BREAST EXCISIONAL BIOPSY Right    BREAST SURGERY     CESAREAN SECTION     COLONOSCOPY     COSMETIC SURGERY  2009   Augmentation mammoplasty   TUBAL LIGATION  1987   Reversal 1991   Family History  Problem Relation Age of Onset   Multiple sclerosis Mother    Arthritis Mother    Colon polyps Father    Heart disease Father    Alcohol abuse Father    Arthritis Father    Cancer Father    Breast cancer Paternal Aunt    Colon cancer Neg Hx    Esophageal cancer Neg Hx    Pancreatic cancer Neg Hx    Stomach cancer Neg Hx    Rectal cancer Neg Hx    Social History   Socioeconomic History   Marital status: Married    Spouse name: Not on file   Number of children: 3   Years of education: Not on file   Highest education level: Not on file  Occupational History   Occupation: Charity fundraiser  Employer: [employer]  Tobacco Use   Smoking status: Never   Smokeless tobacco: Never  Vaping Use   Vaping Use: Never used  Substance and Sexual Activity   Alcohol use: Not Currently   Drug use: Never   Sexual activity: Yes    Birth control/protection: Post-menopausal  Other Topics Concern   Not on file  Social History Narrative   Not on file   Social Determinants of Health   Financial Resource Strain: Low Risk  (09/16/2020)   Overall Financial Resource Strain (CARDIA)    Difficulty of Paying Living Expenses: Not hard at all  Food Insecurity: No Food Insecurity (09/28/2022)   Hunger Vital Sign    Worried About Running Out of Food in the Last Year: Never true    Ran Out of Food in the Last Year: Never true  Transportation Needs: No Transportation Needs (09/28/2022)   PRAPARE -  Administrator, Civil Service (Medical): No    Lack of Transportation (Non-Medical): No  Physical Activity: Sufficiently Active (09/28/2022)   Exercise Vital Sign    Days of Exercise per Week: 6 days    Minutes of Exercise per Session: 80 min  Stress: No Stress Concern Present (09/16/2020)   Sarah Flowers [employer] Occupational Health - Occupational Stress Questionnaire    Feeling of Stress : Not at all  Social Connections: Moderately Integrated (09/16/2020)   Social Connection and Isolation Panel [NHANES]    Frequency of Communication with Friends and Family: More than three times a week    Frequency of Social Gatherings with Friends and Family: More than three times a week    Attends Religious Services: Never    Database administrator or Organizations: Yes    Attends Engineer, structural: More than 4 times per year    Marital Status: Married    Tobacco Counseling Counseling given: Not Answered   Clinical Intake:  Pre-visit preparation completed: Yes  Pain : No/denies pain  BMI - recorded: 21.63 Nutritional Status: BMI of 19-24  Normal Nutritional Risks: None Diabetes: No  How often do you need to have someone help you when you read instructions, pamphlets, or other written materials from your doctor or pharmacy?: 1 - Never  Interpreter Needed?: No  Information entered by :: Donne Anon, CMA   Activities of Daily Living    09/28/2022    9:25 AM  In your present state of health, do you have any difficulty performing the following activities:  Hearing? 0  Vision? 0  Difficulty concentrating or making decisions? 0  Walking or climbing stairs? 0  Dressing or bathing? 0  Doing errands, shopping? 0  Preparing Food and eating ? N  Using the Toilet? N  In the past six months, have you accidently leaked urine? N  Do you have problems with loss of bowel control? N  Managing your Medications? N  Managing your Finances? N  Housekeeping or managing your  Housekeeping? N    Patient Care Team: Sarah Dory, DO as PCP - General (Family Medicine)  Indicate any recent Medical Services you may have received from other than [facility] providers in the past year (date may be approximate).     Assessment:   This is a routine wellness examination for Sarah Flowers.  Hearing/Vision screen No results found.  Dietary issues and exercise activities discussed:     Goals Addressed   None    Depression Screen    09/28/2022    8:55 AM 09/22/2021  10:23 AM 09/22/2021    8:56 AM 09/16/2020   10:25 AM 09/13/2020   12:51 PM 09/11/2019   12:49 PM  PHQ 2/9 Scores  PHQ - 2 Score 0 0 0 0 0 0  PHQ- 9 Score 0  0       Fall Risk    09/28/2022    8:55 AM 09/22/2021   10:22 AM 09/22/2021    8:56 AM 09/16/2020   10:24 AM 09/13/2020   12:50 PM  Fall Risk   Falls in the past year? 0 0 0 1 1  Number falls in past yr: 0 0 0 1 0  Comment     has had a fall while running  Injury with Fall? 0 0 0 1 1  Risk for fall due to : No Fall Risks No Fall Risks No Fall Risks History of fall(s) No Fall Risks  Follow up Falls evaluation completed Falls evaluation completed Falls evaluation completed Falls prevention discussed Falls evaluation completed    MEDICARE RISK AT HOME:  Medicare Risk at Home - 09/28/22 0926     Any stairs in or around the home? Yes    If so, are there any without handrails? No    Home free of loose throw rugs in walkways, pet beds, electrical cords, etc? Yes    Adequate lighting in your home to reduce risk of falls? Yes    Life alert? No    Use of a cane, walker or w/c? No    Grab bars in the bathroom? No    Shower chair or bench in shower? No    Elevated toilet seat or a handicapped toilet? No             TIMED UP AND GO:  Was the test performed?  Yes  Length of time to ambulate 10 feet: 5 sec Gait steady and fast without use of assistive device    Cognitive Function:        09/28/2022    9:29 AM 09/22/2021   10:28 AM  6CIT  Screen  What Year? 0 points 0 points  What month? 0 points 0 points  What time? 0 points 0 points  Count back from 20 0 points 0 points  Months in reverse 0 points 0 points  Repeat phrase 0 points 0 points  Total Score 0 points 0 points    Immunizations Immunization History  Administered Date(s) Administered   Influenza-Unspecified 12/13/2019, 12/21/2020   PFIZER(Purple Top)SARS-COV-2 Vaccination 04/27/2019, 05/22/2019, 01/11/2020, 07/03/2020   Pfizer Covid-19 Vaccine Bivalent Booster 78yrs & up 12/21/2020   Pneumococcal Conjugate-13 11/16/2018   Pneumococcal Polysaccharide-23 01/13/2020   Tdap 06/01/2018   Zoster Recombinant(Shingrix) 10/25/2020, 02/07/2021    TDAP status: Up to date  Flu Vaccine status: Up to date  Pneumococcal vaccine status: Up to date  Covid-19 vaccine status: Information provided on how to obtain vaccines.   Qualifies for Shingles Vaccine? Yes   Zostavax completed No   Shingrix Completed?: Yes  Screening Tests Health Maintenance  Topic Date Due   Medicare Annual Wellness (AWV)  09/23/2022   COVID-19 Vaccine (6 - 2023-24 season) 03/31/2023 (Originally 11/21/2021)   INFLUENZA VACCINE  10/22/2022   MAMMOGRAM  04/22/2023   DTaP/Tdap/Td (2 - Td or Tdap) 05/31/2028   Colonoscopy  07/18/2028   Pneumonia Vaccine 60+ Years old  Completed   DEXA SCAN  Completed   Hepatitis C Screening  Completed   Zoster Vaccines- Shingrix  Completed   HPV VACCINES  Aged  Out    Health Maintenance  Health Maintenance Due  Topic Date Due   Medicare Annual Wellness (AWV)  09/23/2022    Colorectal cancer screening: Type of screening: Colonoscopy. Completed 07/18/21. Repeat every 7 years  Mammogram status: Completed 04/21/21. Repeat every year  Bone Density status: Completed 02/16/22. Results reflect: Bone density results: OSTEOPENIA. Repeat every 2 years.  Lung Cancer Screening: (Low Dose CT Chest recommended if Age 31-80 years, 20 pack-year currently smoking OR  have quit w/in 15years.) does not qualify.   Additional Screening:  Hepatitis C Screening: does qualify; Completed 06/01/18  Vision Screening: Recommended annual ophthalmology exams for early detection of glaucoma and other disorders of the eye. Is the patient up to date with their annual eye exam?  Yes  Who is the provider or what is the name of the office in which the patient attends annual eye exams? Dr. Cathey Endow If pt is not established with a provider, would they like to be referred to a provider to establish care? No .   Dental Screening: Recommended annual dental exams for proper oral hygiene  Diabetic Foot Exam: N/a  Community Resource Referral / Chronic Care Management: CRR required this visit?  No   CCM required this visit?  No     Plan:     I have personally reviewed and noted the following in the patient's chart:   Medical and social history Use of alcohol, tobacco or illicit drugs  Current medications and supplements including opioid prescriptions. Patient is not currently taking opioid prescriptions. Functional ability and status Nutritional status Physical activity Advanced directives List of other physicians Hospitalizations, surgeries, and ER visits in previous 12 months Vitals Screenings to include cognitive, depression, and falls Referrals and appointments  In addition, I have reviewed and discussed with patient certain preventive protocols, quality metrics, and best practice recommendations. A written personalized care plan for preventive services as well as general preventive health recommendations were provided to patient.     Donne Anon, CMA   09/28/2022   After Visit Summary: Sent to mychart  Nurse Notes: None

## 2022-09-28 NOTE — Patient Instructions (Signed)
Sarah Flowers , Thank you for taking time to come for your Medicare Wellness Visit. I appreciate your ongoing commitment to your health goals. Please review the following plan we discussed and let me know if I can assist you in the future.   These are the goals we discussed:  Goals      Patient Stated     Maintain current active lifestyle        This is a list of the screening recommended for you and due dates:  Health Maintenance  Topic Date Due   COVID-19 Vaccine (6 - 2023-24 season) 03/31/2023*   Flu Shot  10/22/2022   Mammogram  04/22/2023   Medicare Annual Wellness Visit  09/28/2023   DTaP/Tdap/Td vaccine (2 - Td or Tdap) 05/31/2028   Colon Cancer Screening  07/18/2028   Pneumonia Vaccine  Completed   DEXA scan (bone density measurement)  Completed   Hepatitis C Screening  Completed   Zoster (Shingles) Vaccine  Completed   HPV Vaccine  Aged Out  *Topic was postponed. The date shown is not the original due date.     Next appointment: Follow up in one year for your annual wellness visit.   Preventive Care 2 Years and Older, Female Preventive care refers to lifestyle choices and visits with your health care provider that can promote health and wellness. What does preventive care include? A yearly physical exam. This is also called an annual well check. Dental exams once or twice a year. Routine eye exams. Ask your health care provider how often you should have your eyes checked. Personal lifestyle choices, including: Daily care of your teeth and gums. Regular physical activity. Eating a healthy diet. Avoiding tobacco and drug use. Limiting alcohol use. Practicing safe sex. Taking low-dose aspirin every day. Taking vitamin and mineral supplements as recommended by your health care provider. What happens during an annual well check? The services and screenings done by your health care provider during your annual well check will depend on your age, overall health,  lifestyle risk factors, and family history of disease. Counseling  Your health care provider may ask you questions about your: Alcohol use. Tobacco use. Drug use. Emotional well-being. Home and relationship well-being. Sexual activity. Eating habits. History of falls. Memory and ability to understand (cognition). Work and work Astronomer. Reproductive health. Screening  You may have the following tests or measurements: Height, weight, and BMI. Blood pressure. Lipid and cholesterol levels. These may be checked every 5 years, or more frequently if you are over 59 years old. Skin check. Lung cancer screening. You may have this screening every year starting at age 71 if you have a 30-pack-year history of smoking and currently smoke or have quit within the past 15 years. Fecal occult blood test (FOBT) of the stool. You may have this test every year starting at age 84. Flexible sigmoidoscopy or colonoscopy. You may have a sigmoidoscopy every 5 years or a colonoscopy every 10 years starting at age 61. Hepatitis C blood test. Hepatitis B blood test. Sexually transmitted disease (STD) testing. Diabetes screening. This is done by checking your blood sugar (glucose) after you have not eaten for a while (fasting). You may have this done every 1-3 years. Bone density scan. This is done to screen for osteoporosis. You may have this done starting at age 22. Mammogram. This may be done every 1-2 years. Talk to your health care provider about how often you should have regular mammograms. Talk with your health care  provider about your test results, treatment options, and if necessary, the need for more tests. Vaccines  Your health care provider may recommend certain vaccines, such as: Influenza vaccine. This is recommended every year. Tetanus, diphtheria, and acellular pertussis (Tdap, Td) vaccine. You may need a Td booster every 10 years. Zoster vaccine. You may need this after age  66. Pneumococcal 13-valent conjugate (PCV13) vaccine. One dose is recommended after age 75. Pneumococcal polysaccharide (PPSV23) vaccine. One dose is recommended after age 37. Talk to your health care provider about which screenings and vaccines you need and how often you need them. This information is not intended to replace advice given to you by your health care provider. Make sure you discuss any questions you have with your health care provider. Document Released: 04/05/2015 Document Revised: 11/27/2015 Document Reviewed: 01/08/2015 Elsevier Interactive Patient Education  2017 [employer].  Fall Prevention in the Home Falls can cause injuries. They can happen to people of all ages. There are many things you can do to make your home safe and to help prevent falls. What can I do on the outside of my home? Regularly fix the edges of walkways and driveways and fix any cracks. Remove anything that might make you trip as you walk through a door, such as a raised step or threshold. Trim any bushes or trees on the path to your home. Use bright outdoor lighting. Clear any walking paths of anything that might make someone trip, such as rocks or tools. Regularly check to see if handrails are loose or broken. Make sure that both sides of any steps have handrails. Any raised decks and porches should have guardrails on the edges. Have any leaves, snow, or ice cleared regularly. Use sand or salt on walking paths during winter. Clean up any spills in your garage right away. This includes oil or grease spills. What can I do in the bathroom? Use night lights. Install grab bars by the toilet and in the tub and shower. Do not use towel bars as grab bars. Use non-skid mats or decals in the tub or shower. If you need to sit down in the shower, use a plastic, non-slip stool. Keep the floor dry. Clean up any water that spills on the floor as soon as it happens. Remove soap buildup in the tub or shower  regularly. Attach bath mats securely with double-sided non-slip rug tape. Do not have throw rugs and other things on the floor that can make you trip. What can I do in the bedroom? Use night lights. Make sure that you have a light by your bed that is easy to reach. Do not use any sheets or blankets that are too big for your bed. They should not hang down onto the floor. Have a firm chair that has side arms. You can use this for support while you get dressed. Do not have throw rugs and other things on the floor that can make you trip. What can I do in the kitchen? Clean up any spills right away. Avoid walking on wet floors. Keep items that you use a lot in easy-to-reach places. If you need to reach something above you, use a strong step stool that has a grab bar. Keep electrical cords out of the way. Do not use floor polish or wax that makes floors slippery. If you must use wax, use non-skid floor wax. Do not have throw rugs and other things on the floor that can make you trip. What can I  do with my stairs? Do not leave any items on the stairs. Make sure that there are handrails on both sides of the stairs and use them. Fix handrails that are broken or loose. Make sure that handrails are as long as the stairways. Check any carpeting to make sure that it is firmly attached to the stairs. Fix any carpet that is loose or worn. Avoid having throw rugs at the top or bottom of the stairs. If you do have throw rugs, attach them to the floor with carpet tape. Make sure that you have a light switch at the top of the stairs and the bottom of the stairs. If you do not have them, ask someone to add them for you. What else can I do to help prevent falls? Wear shoes that: Do not have high heels. Have rubber bottoms. Are comfortable and fit you well. Are closed at the toe. Do not wear sandals. If you use a stepladder: Make sure that it is fully opened. Do not climb a closed stepladder. Make sure that  both sides of the stepladder are locked into place. Ask someone to hold it for you, if possible. Clearly mark and make sure that you can see: Any grab bars or handrails. First and last steps. Where the edge of each step is. Use tools that help you move around (mobility aids) if they are needed. These include: Canes. Walkers. Scooters. Crutches. Turn on the lights when you go into a dark area. Replace any light bulbs as soon as they burn out. Set up your furniture so you have a clear path. Avoid moving your furniture around. If any of your floors are uneven, fix them. If there are any pets around you, be aware of where they are. Review your medicines with your doctor. Some medicines can make you feel dizzy. This can increase your chance of falling. Ask your doctor what other things that you can do to help prevent falls. This information is not intended to replace advice given to you by your health care provider. Make sure you discuss any questions you have with your health care provider. Document Released: 01/03/2009 Document Revised: 08/15/2015 Document Reviewed: 04/13/2014 Elsevier Interactive Patient Education  2017 [employer].

## 2022-09-29 ENCOUNTER — Encounter: Payer: Self-pay | Admitting: Family Medicine

## 2022-09-30 DIAGNOSIS — M79641 Pain in right hand: Secondary | ICD-10-CM | POA: Diagnosis not present

## 2022-10-01 DIAGNOSIS — H5203 Hypermetropia, bilateral: Secondary | ICD-10-CM | POA: Diagnosis not present

## 2022-10-01 DIAGNOSIS — H2513 Age-related nuclear cataract, bilateral: Secondary | ICD-10-CM | POA: Diagnosis not present

## 2022-11-12 DIAGNOSIS — Z0100 Encounter for examination of eyes and vision without abnormal findings: Secondary | ICD-10-CM | POA: Diagnosis not present

## 2023-01-25 DIAGNOSIS — H43813 Vitreous degeneration, bilateral: Secondary | ICD-10-CM | POA: Diagnosis not present

## 2023-01-25 DIAGNOSIS — H2513 Age-related nuclear cataract, bilateral: Secondary | ICD-10-CM | POA: Diagnosis not present

## 2023-01-25 DIAGNOSIS — H353132 Nonexudative age-related macular degeneration, bilateral, intermediate dry stage: Secondary | ICD-10-CM | POA: Diagnosis not present

## 2023-02-10 DIAGNOSIS — S93491A Sprain of other ligament of right ankle, initial encounter: Secondary | ICD-10-CM | POA: Diagnosis not present

## 2023-02-10 DIAGNOSIS — M25571 Pain in right ankle and joints of right foot: Secondary | ICD-10-CM | POA: Diagnosis not present

## 2023-03-02 DIAGNOSIS — M25571 Pain in right ankle and joints of right foot: Secondary | ICD-10-CM | POA: Diagnosis not present

## 2023-04-26 DIAGNOSIS — Z01419 Encounter for gynecological examination (general) (routine) without abnormal findings: Secondary | ICD-10-CM | POA: Diagnosis not present

## 2023-04-26 DIAGNOSIS — Z90711 Acquired absence of uterus with remaining cervical stump: Secondary | ICD-10-CM | POA: Diagnosis not present

## 2023-04-26 DIAGNOSIS — E2839 Other primary ovarian failure: Secondary | ICD-10-CM | POA: Diagnosis not present

## 2023-04-27 ENCOUNTER — Other Ambulatory Visit: Payer: Self-pay | Admitting: Obstetrics and Gynecology

## 2023-04-27 DIAGNOSIS — E2839 Other primary ovarian failure: Secondary | ICD-10-CM

## 2023-08-12 ENCOUNTER — Other Ambulatory Visit: Payer: Self-pay | Admitting: Obstetrics and Gynecology

## 2023-08-12 ENCOUNTER — Ambulatory Visit
Admission: RE | Admit: 2023-08-12 | Discharge: 2023-08-12 | Disposition: A | Discharge status: Home / Self Care | Source: Ambulatory Visit | Attending: Obstetrics and Gynecology | Admitting: Obstetrics and Gynecology

## 2023-08-12 DIAGNOSIS — Z1231 Encounter for screening mammogram for malignant neoplasm of breast: Secondary | ICD-10-CM

## 2023-08-25 DIAGNOSIS — L820 Inflamed seborrheic keratosis: Secondary | ICD-10-CM | POA: Diagnosis not present

## 2023-08-25 DIAGNOSIS — D1801 Hemangioma of skin and subcutaneous tissue: Secondary | ICD-10-CM | POA: Diagnosis not present

## 2023-08-25 DIAGNOSIS — L821 Other seborrheic keratosis: Secondary | ICD-10-CM | POA: Diagnosis not present

## 2023-08-25 DIAGNOSIS — L814 Other melanin hyperpigmentation: Secondary | ICD-10-CM | POA: Diagnosis not present

## 2023-08-25 DIAGNOSIS — D229 Melanocytic nevi, unspecified: Secondary | ICD-10-CM | POA: Diagnosis not present

## 2023-08-25 DIAGNOSIS — L578 Other skin changes due to chronic exposure to nonionizing radiation: Secondary | ICD-10-CM | POA: Diagnosis not present

## 2023-09-28 ENCOUNTER — Ambulatory Visit (INDEPENDENT_AMBULATORY_CARE_PROVIDER_SITE_OTHER)

## 2023-09-28 VITALS — Ht 64.5 in | Wt 125.0 lb

## 2023-09-28 DIAGNOSIS — Z0000 Encounter for general adult medical examination without abnormal findings: Secondary | ICD-10-CM

## 2023-09-28 NOTE — Patient Instructions (Signed)
 Sarah Flowers , Thank you for taking time out of your busy schedule to complete your Annual Wellness Visit with me. I enjoyed our conversation and look forward to speaking with you again next year. I, as well as your care team,  appreciate your ongoing commitment to your health goals. Please review the following plan we discussed and let me know if I can assist you in the future. Your Game plan/ To Do List    Referrals: If you haven't heard from the office you've been referred to, please reach out to them at the phone provided.   Follow up Visits: Next Medicare AWV with our clinical staff: 10/03/24 @ 2:30p   Have you seen your provider in the last 6 months (3 months if uncontrolled diabetes)?  Next Office Visit with your provider: 10/05/23 @ 1:30p  Clinician Recommendations:  Aim for 30 minutes of exercise or brisk walking, 6-8 glasses of water, and 5 servings of fruits and vegetables each day.       This is a list of the screening recommended for you and due dates:  Health Maintenance  Topic Date Due   COVID-19 Vaccine (6 - 2024-25 season) 11/22/2022   Flu Shot  10/22/2023   Medicare Annual Wellness Visit  09/27/2024   Mammogram  08/11/2025   DTaP/Tdap/Td vaccine (2 - Td or Tdap) 05/31/2028   Colon Cancer Screening  07/18/2028   Pneumococcal Vaccine for age over 36  Completed   DEXA scan (bone density measurement)  Completed   Hepatitis C Screening  Completed   Zoster (Shingles) Vaccine  Completed   Hepatitis B Vaccine  Aged Out   HPV Vaccine  Aged Out   Meningitis B Vaccine  Aged Out    Advanced directives: (In Chart) A copy of your advanced directives are scanned into your chart should your provider ever need it. Advance Care Planning is important because it:  [x]  Makes sure you receive the medical care that is consistent with your values, goals, and preferences  [x]  It provides guidance to your family and loved ones and reduces their decisional burden about whether or not they are  making the right decisions based on your wishes.  Follow the link provided in your after visit summary or read over the paperwork we have mailed to you to help you started getting your Advance Directives in place. If you need assistance in completing these, please reach out to us  so that we can help you!  See attachments for Preventive Care and Fall Prevention Tips.

## 2023-09-28 NOTE — Progress Notes (Signed)
 Subjective:   Sarah Flowers is a 68 y.o. who presents for a Medicare Wellness preventive visit.  As a reminder, Annual Wellness Visits don't include a physical exam, and some assessments may be limited, especially if this visit is performed virtually. We may recommend an in-person follow-up visit with your provider if needed.  Visit Complete: Virtual I connected with  Sarah Flowers on 09/28/23 by a video and audio enabled telemedicine application and verified that I am speaking with the correct person using two identifiers.  Patient Location: Home  Provider Location: Home Office  I discussed the limitations of evaluation and management by telemedicine. The patient expressed understanding and agreed to proceed.  Vital Signs: Because this visit was a virtual/telehealth visit, some criteria may be missing or patient reported. Any vitals not documented were not able to be obtained and vitals that have been documented are patient reported.    Persons Participating in Visit: Patient.  AWV Questionnaire: Yes: Patient Medicare AWV questionnaire was completed by the patient on 09/21/23; I have confirmed that all information answered by patient is correct and no changes since this date.  Cardiac Risk Factors include: advanced age (>60men, >93 women)     Objective:    Today's Vitals   09/28/23 1433  Weight: 125 lb (56.7 kg)  Height: 5' 4.5 (1.638 m)   Body mass index is 21.12 kg/m.     09/28/2023    2:39 PM 09/28/2022    9:26 AM 09/22/2021   10:22 AM 09/16/2020   10:23 AM  Advanced Directives  Does Patient Have a Medical Advance Directive? Yes Yes Yes Yes  Type of Estate agent of [place];Living will Healthcare Power of [place];Living will Healthcare Power of [place];Out of facility DNR (pink MOST or yellow form);Living will Healthcare Power of [place];Living will  Does patient want to make changes to medical advance directive? No - Patient declined No -  Patient declined    Copy of Healthcare Power of Attorney in Chart? Yes - validated most recent copy scanned in chart (See row information) No - copy requested No - copy requested No - copy requested    Current Medications (verified) Outpatient Encounter Medications as of 09/28/2023  Medication Sig   Calcium Citrate-Vitamin D (CALCIUM + D PO) Take 1 capsule by mouth daily.   cholecalciferol (VITAMIN D3) 25 MCG (1000 UNIT) tablet Take 1,000 Units by mouth daily.   estradiol  (ESTRACE  VAGINAL) 0.1 MG/GM vaginal cream Insert 1 gram vaginally for 2 weeks followed by 3 times per week.   Multiple Vitamins-Minerals (PRESERVISION AREDS 2+MULTI VIT PO) Take 2 tablets by mouth daily.   Omega-3 Fatty Acids (FISH OIL OMEGA-3 PO) Take 1 capsule by mouth daily.   OVER THE COUNTER MEDICATION Opti fiber: one teaspoon daily   No facility-administered encounter medications on file as of 09/28/2023.    Allergies (verified) Sulfa antibiotics   History: Past Medical History:  Diagnosis Date   Allergy 2013   Sulfa   Arthritis 06/2022   Both Hands; rheumatoid lab done; per Dr Alyse   Blood transfusion without reported diagnosis 1991   Placental abruption   GERD (gastroesophageal reflux disease)    History of colon polyps    Hyperlipidemia 2023   Macular degeneration    Rosacea    Past Surgical History:  Procedure Laterality Date   ABDOMINAL HYSTERECTOMY  1997   AUGMENTATION MAMMAPLASTY Bilateral 2009   Silicone   BREAST BIOPSY Bilateral    multiple, all benign  BREAST EXCISIONAL BIOPSY Left    BREAST EXCISIONAL BIOPSY Left    BREAST EXCISIONAL BIOPSY Right    BREAST SURGERY     CESAREAN SECTION     COLONOSCOPY     COSMETIC SURGERY  2009   Augmentation mammoplasty   TUBAL LIGATION  1987   Reversal 1991   Family History  Problem Relation Age of Onset   Multiple sclerosis Mother    Arthritis Mother    Colon polyps Father    Heart disease Father    Alcohol abuse Father    Arthritis  Father    Cancer Father    Breast cancer Paternal Aunt    Colon cancer Neg Hx    Esophageal cancer Neg Hx    Pancreatic cancer Neg Hx    Stomach cancer Neg Hx    Rectal cancer Neg Hx    Social History   Socioeconomic History   Marital status: Married    Spouse name: Not on file   Number of children: 3   Years of education: Not on file   Highest education level: Associate degree: occupational, Scientist, product/process development, or vocational program  Occupational History   Occupation: Teacher, adult education: [employer]  Tobacco Use   Smoking status: Never   Smokeless tobacco: Never  Vaping Use   Vaping status: Never Used  Substance and Sexual Activity   Alcohol use: Not Currently   Drug use: Never   Sexual activity: Yes    Birth control/protection: Post-menopausal  Other Topics Concern   Not on file  Social History Narrative   Not on file   Social Drivers of Health   Financial Resource Strain: Low Risk  (09/28/2023)   Overall Financial Resource Strain (CARDIA)    Difficulty of Paying Living Expenses: Not hard at all  Food Insecurity: No Food Insecurity (09/28/2023)   Hunger Vital Sign    Worried About Running Out of Food in the Last Year: Never true    Ran Out of Food in the Last Year: Never true  Transportation Needs: No Transportation Needs (09/28/2023)   PRAPARE - Administrator, Civil Service (Medical): No    Lack of Transportation (Non-Medical): No  Physical Activity: Sufficiently Active (09/28/2023)   Exercise Vital Sign    Days of Exercise per Week: 7 days    Minutes of Exercise per Session: 50 min  Stress: No Stress Concern Present (09/28/2023)   [employer] of Occupational Health - Occupational Stress Questionnaire    Feeling of Stress: Not at all  Social Connections: Socially Integrated (09/28/2023)   Social Connection and Isolation Panel    Frequency of Communication with Friends and Family: Never    Frequency of Social Gatherings with Friends and Family: More than three  times a week    Attends Religious Services: More than 4 times per year    Active Member of [employer] or Organizations: Yes    Attends Engineer, structural: More than 4 times per year    Marital Status: Married    Tobacco Counseling Counseling given: Not Answered    Clinical Intake:  Pre-visit preparation completed: Yes  Pain : No/denies pain     BMI - recorded: 21.12 Nutritional Status: BMI of 19-24  Normal Nutritional Risks: None Diabetes: No  Lab Results  Component Value Date   HGBA1C 5.6 09/11/2019     How often do you need to have someone help you when you read instructions, pamphlets, or other written materials from your  doctor or pharmacy?: 1 - Never  Interpreter Needed?: No  Information entered by :: Sarah Blush LPN   Activities of Daily Living      09/28/2023    2:38 PM 09/21/2023    9:15 AM  In your present state of health, do you have any difficulty performing the following activities:  Hearing? 0 0  Vision? 0 0  Difficulty concentrating or making decisions? 0 0  Walking or climbing stairs? 0 0  Dressing or bathing? 0 0  Doing errands, shopping? 0 0  Preparing Food and eating ? N N  Using the Toilet? N N  In the past six months, have you accidently leaked urine? N N  Do you have problems with loss of bowel control? N N  Managing your Medications? N N  Managing your Finances? N N  Housekeeping or managing your Housekeeping? N N    Patient Care Team: Frann Mabel Mt, DO as PCP - General (Family Medicine)   I have updated your Care Teams any recent Medical Services you may have received from other providers in the past year.     Assessment:   This is a routine wellness examination for Sarah Flowers.  Hearing/Vision screen Hearing Screening - Comments:: Denies hearing difficulties   Vision Screening - Comments:: Wears rx glasses - up to date with routine eye exams with  Dr Waylan   Goals Addressed               This Visit's  Progress     Increase physical activity (pt-stated)        Remain active.       Depression Screen      09/28/2023    2:40 PM 09/28/2022    8:55 AM 09/22/2021   10:23 AM 09/22/2021    8:56 AM 09/16/2020   10:25 AM 09/13/2020   12:51 PM 09/11/2019   12:49 PM  PHQ 2/9 Scores  PHQ - 2 Score 0 0 0 0 0 0 0  PHQ- 9 Score  0  0       Fall Risk      09/28/2023    2:38 PM 09/21/2023    9:15 AM 09/28/2022    8:55 AM 09/22/2021   10:22 AM 09/22/2021    8:56 AM  Fall Risk   Falls in the past year? 0 0 0 0 0  Number falls in past yr: 0 0 0 0 0  Injury with Fall? 0 0 0 0 0  Risk for fall due to : No Fall Risks  No Fall Risks No Fall Risks No Fall Risks  Follow up Falls evaluation completed  Falls evaluation completed Falls evaluation completed  Falls evaluation completed      Data saved with a previous flowsheet row definition    MEDICARE RISK AT HOME:  Medicare Risk at Home Any stairs in or around the home?: Yes If so, are there any without handrails?: No Home free of loose throw rugs in walkways, pet beds, electrical cords, etc?: Yes Adequate lighting in your home to reduce risk of falls?: Yes Life alert?: No Use of a cane, walker or w/c?: No Grab bars in the bathroom?: No Shower chair or bench in shower?: No Elevated toilet seat or a handicapped toilet?: No  TIMED UP AND GO:  Was the test performed?  No  Cognitive Function: 6CIT completed        09/28/2023    2:39 PM 09/28/2022    9:29 AM 09/22/2021  10:28 AM  6CIT Screen  What Year? 0 points 0 points 0 points  What month? 0 points 0 points 0 points  What time? 0 points 0 points 0 points  Count back from 20 0 points 0 points 0 points  Months in reverse 0 points 0 points 0 points  Repeat phrase 0 points 0 points 0 points  Total Score 0 points 0 points 0 points    Immunizations Immunization History  Administered Date(s) Administered   Influenza-Unspecified 12/13/2019, 12/21/2020, 12/26/2021   PFIZER(Purple Top)SARS-COV-2  Vaccination 04/27/2019, 05/22/2019, 01/11/2020, 07/03/2020   Pfizer Covid-19 Vaccine Bivalent Booster 7yrs & up 12/21/2020   Pneumococcal Conjugate-13 11/16/2018   Pneumococcal Polysaccharide-23 01/13/2020   Tdap 06/01/2018   Zoster Recombinant(Shingrix) 10/25/2020, 02/07/2021    Screening Tests Health Maintenance  Topic Date Due   COVID-19 Vaccine (6 - 2024-25 season) 11/22/2022   INFLUENZA VACCINE  10/22/2023   Medicare Annual Wellness (AWV)  09/27/2024   MAMMOGRAM  08/11/2025   DTaP/Tdap/Td (2 - Td or Tdap) 05/31/2028   Colonoscopy  07/18/2028   Pneumococcal Vaccine: 50+ Years  Completed   DEXA SCAN  Completed   Hepatitis C Screening  Completed   Zoster Vaccines- Shingrix  Completed   Hepatitis B Vaccines  Aged Out   HPV VACCINES  Aged Out   Meningococcal B Vaccine  Aged Out    Health Maintenance  Health Maintenance Due  Topic Date Due   COVID-19 Vaccine (6 - 2024-25 season) 11/22/2022   Health Maintenance Items Addressed:   Additional Screening:  Vision Screening: Recommended annual ophthalmology exams for early detection of glaucoma and other disorders of the eye. Would you like a referral to an eye doctor? No    Dental Screening: Recommended annual dental exams for proper oral hygiene  Community Resource Referral / Chronic Care Management: CRR required this visit?  No   CCM required this visit?  No   Plan:    I have personally reviewed and noted the following in the patient's chart:   Medical and social history Use of alcohol, tobacco or illicit drugs  Current medications and supplements including opioid prescriptions. Patient is not currently taking opioid prescriptions. Functional ability and status Nutritional status Physical activity Advanced directives List of other physicians Hospitalizations, surgeries, and ER visits in previous 12 months Vitals Screenings to include cognitive, depression, and falls Referrals and appointments  In  addition, I have reviewed and discussed with patient certain preventive protocols, quality metrics, and best practice recommendations. A written personalized care plan for preventive services as well as general preventive health recommendations were provided to patient.   Sarah LELON Blush, LPN   04/23/7972   After Visit Summary: (MyChart) Due to this being a telephonic visit, the after visit summary with patients personalized plan was offered to patient via MyChart   Notes: Nothing significant to report at this time.

## 2023-10-01 ENCOUNTER — Encounter: Payer: Medicare HMO | Admitting: Family Medicine

## 2023-10-04 ENCOUNTER — Encounter: Admitting: Family Medicine

## 2023-10-04 DIAGNOSIS — H2513 Age-related nuclear cataract, bilateral: Secondary | ICD-10-CM | POA: Diagnosis not present

## 2023-10-04 DIAGNOSIS — H524 Presbyopia: Secondary | ICD-10-CM | POA: Diagnosis not present

## 2023-10-05 ENCOUNTER — Ambulatory Visit (INDEPENDENT_AMBULATORY_CARE_PROVIDER_SITE_OTHER): Admitting: Family Medicine

## 2023-10-05 ENCOUNTER — Encounter: Payer: Self-pay | Admitting: Family Medicine

## 2023-10-05 VITALS — BP 118/72 | HR 70 | Temp 98.0°F | Resp 16 | Ht 64.0 in | Wt 129.8 lb

## 2023-10-05 DIAGNOSIS — E7800 Pure hypercholesterolemia, unspecified: Secondary | ICD-10-CM

## 2023-10-05 DIAGNOSIS — Z0000 Encounter for general adult medical examination without abnormal findings: Secondary | ICD-10-CM | POA: Diagnosis not present

## 2023-10-05 DIAGNOSIS — M8580 Other specified disorders of bone density and structure, unspecified site: Secondary | ICD-10-CM

## 2023-10-05 LAB — LIPID PANEL
Cholesterol: 202 mg/dL — ABNORMAL HIGH (ref 0–200)
HDL: 78.6 mg/dL (ref >39.00)
LDL Cholesterol: 108 mg/dL — ABNORMAL HIGH (ref 0–99)
NonHDL: 123.25
Total CHOL/HDL Ratio: 3
Triglycerides: 76 mg/dL (ref 0.0–149.0)
VLDL: 15.2 mg/dL (ref 0.0–40.0)

## 2023-10-05 LAB — COMPREHENSIVE METABOLIC PANEL WITH GFR
ALT: 23 U/L (ref 0–35)
AST: 29 U/L (ref 0–37)
Albumin: 4.6 g/dL (ref 3.5–5.2)
Alkaline Phosphatase: 72 U/L (ref 39–117)
BUN: 23 mg/dL (ref 6–23)
CO2: 30 meq/L (ref 19–32)
Calcium: 9.9 mg/dL (ref 8.4–10.5)
Chloride: 100 meq/L (ref 96–112)
Creatinine, Ser: 0.92 mg/dL (ref 0.40–1.20)
GFR: 63.29 mL/min (ref >60.00)
Glucose, Bld: 85 mg/dL (ref 70–99)
Potassium: 4.1 meq/L (ref 3.5–5.1)
Sodium: 139 meq/L (ref 135–145)
Total Bilirubin: 0.6 mg/dL (ref 0.2–1.2)
Total Protein: 6.5 g/dL (ref 6.0–8.3)

## 2023-10-05 LAB — VITAMIN D 25 HYDROXY (VIT D DEFICIENCY, FRACTURES): VITD: 36.46 ng/mL (ref 30.00–100.00)

## 2023-10-05 NOTE — Patient Instructions (Signed)
Keep the diet clean and stay active.  Give us 2-3 business days to get the results of your labs back.   Let us know if you need anything.  

## 2023-10-05 NOTE — Progress Notes (Signed)
 Chief Complaint  Patient presents with   Annual Exam    CPE     Well Woman Sarah Flowers is here for a complete physical.   Her last physical was >1 year ago.  Current diet: in general, a healthy diet. Current exercise: running, strength training. Weight is stable and she denies daytime fatigue. Seatbelt? Yes Advanced directive? Yes  Health Maintenance Colonoscopy- Yes Shingrix- Yes DEXA- Yes Mammogram- Yes Tetanus- Yes Pneumonia- Yes Hep C screen- Yes  Past Medical History:  Diagnosis Date   Allergy 2013   Sulfa   Arthritis 06/2022   Both Hands; rheumatoid lab done; per Dr Alyse   Blood transfusion without reported diagnosis 1991   Placental abruption   GERD (gastroesophageal reflux disease)    History of colon polyps    Hyperlipidemia 2023   Macular degeneration    Rosacea      Past Surgical History:  Procedure Laterality Date   ABDOMINAL HYSTERECTOMY  1997   AUGMENTATION MAMMAPLASTY Bilateral 2009   Silicone   BREAST BIOPSY Bilateral    multiple, all benign    BREAST EXCISIONAL BIOPSY Left    BREAST EXCISIONAL BIOPSY Left    BREAST EXCISIONAL BIOPSY Right    BREAST SURGERY     CESAREAN SECTION     COLONOSCOPY     COSMETIC SURGERY  2009   Augmentation mammoplasty   TUBAL LIGATION  1987   Reversal 1991    Medications  Current Outpatient Medications on File Prior to Visit  Medication Sig Dispense Refill   Calcium Citrate-Vitamin D  (CALCIUM + D PO) Take 1 capsule by mouth daily.     cholecalciferol (VITAMIN D3) 25 MCG (1000 UNIT) tablet Take 1,000 Units by mouth daily.     estradiol  (ESTRACE  VAGINAL) 0.1 MG/GM vaginal cream Insert 1 gram vaginally for 2 weeks followed by 3 times per week. 42.5 g 10   Multiple Vitamins-Minerals (PRESERVISION AREDS 2+MULTI VIT PO) Take 2 tablets by mouth daily.     Omega-3 Fatty Acids (FISH OIL OMEGA-3 PO) Take 1 capsule by mouth daily.     OVER THE COUNTER MEDICATION Opti fiber: one teaspoon daily       Allergies Allergies  Allergen Reactions   Sulfa Antibiotics Other (See Comments)    Mouth sores    Review of Systems: Constitutional:  no fevers Eye:  no recent significant change in vision Ears:  No changes in hearing Nose/Mouth/Throat:  no complaints of nasal congestion, no sore throat Cardiovascular: no chest pain Respiratory:  No shortness of breath Gastrointestinal:  No change in bowel habits GU:  Female: negative for dysuria Integumentary:  no abnormal skin lesions reported Neurologic:  no headaches Endocrine:  denies unexplained weight changes  Exam BP 118/72 (BP Location: Left Arm, Patient Position: Sitting)   Pulse 70   Temp 98 F (36.7 C) (Oral)   Resp 16   Ht 5' 4 (1.626 m)   Wt 129 lb 12.8 oz (58.9 kg)   BMI 22.28 kg/m  General:  well developed, well nourished, in no apparent distress Skin:  no significant moles, warts, or growths Head:  no masses, lesions, or tenderness Eyes:  pupils equal and round, sclera anicteric without injection Ears:  canals without lesions, TMs shiny without retraction, no obvious effusion, no erythema Nose:  nares patent, mucosa normal, and no drainage Throat/Pharynx:  lips and gingiva without lesion; tongue and uvula midline; non-inflamed pharynx; no exudates or postnasal drainage Neck: neck supple without adenopathy, thyromegaly, or masses Lungs:  clear to auscultation, breath sounds equal bilaterally, no respiratory distress Cardio:  regular rate and rhythm, no bruits or LE edema Abdomen:  abdomen soft, nontender; bowel sounds normal; no masses or organomegaly Genital: Deferred Neuro:  gait normal; deep tendon reflexes normal and symmetric Psych: well oriented with normal range of affect and appropriate judgment/insight  Assessment and Plan  Well adult exam  Pure hypercholesterolemia - Plan: Comprehensive metabolic panel with GFR, Lipid panel  Osteopenia, unspecified location - Plan: VITAMIN D  25 Hydroxy (Vit-D  Deficiency, Fractures)   Well 68 y.o. female. Counseled on diet and exercise. Other orders as above. Follow up in 1 yr or prn. The patient voiced understanding and agreement to the plan.  Mabel [place], DO 10/05/23 1:54 PM

## 2023-10-06 ENCOUNTER — Ambulatory Visit: Payer: Self-pay | Admitting: Family Medicine

## 2023-12-02 DIAGNOSIS — M25522 Pain in left elbow: Secondary | ICD-10-CM | POA: Diagnosis not present

## 2023-12-06 DIAGNOSIS — Z23 Encounter for immunization: Secondary | ICD-10-CM | POA: Diagnosis not present

## 2023-12-21 DIAGNOSIS — D1722 Benign lipomatous neoplasm of skin and subcutaneous tissue of left arm: Secondary | ICD-10-CM | POA: Diagnosis not present

## 2023-12-21 DIAGNOSIS — M67824 Other specified disorders of tendon, left elbow: Secondary | ICD-10-CM | POA: Diagnosis not present

## 2023-12-21 DIAGNOSIS — L905 Scar conditions and fibrosis of skin: Secondary | ICD-10-CM | POA: Diagnosis not present

## 2023-12-21 DIAGNOSIS — M65812 Other synovitis and tenosynovitis, left shoulder: Secondary | ICD-10-CM | POA: Diagnosis not present

## 2023-12-21 DIAGNOSIS — R2232 Localized swelling, mass and lump, left upper limb: Secondary | ICD-10-CM | POA: Diagnosis not present

## 2024-01-27 DIAGNOSIS — H353132 Nonexudative age-related macular degeneration, bilateral, intermediate dry stage: Secondary | ICD-10-CM | POA: Diagnosis not present

## 2024-01-27 DIAGNOSIS — H2513 Age-related nuclear cataract, bilateral: Secondary | ICD-10-CM | POA: Diagnosis not present

## 2024-01-27 DIAGNOSIS — H43813 Vitreous degeneration, bilateral: Secondary | ICD-10-CM | POA: Diagnosis not present

## 2024-02-21 ENCOUNTER — Ambulatory Visit (HOSPITAL_BASED_OUTPATIENT_CLINIC_OR_DEPARTMENT_OTHER)
Admission: RE | Admit: 2024-02-21 | Discharge: 2024-02-21 | Disposition: A | Source: Ambulatory Visit | Attending: Obstetrics and Gynecology | Admitting: Obstetrics and Gynecology

## 2024-02-21 ENCOUNTER — Other Ambulatory Visit: Payer: Medicare HMO

## 2024-02-21 DIAGNOSIS — Z780 Asymptomatic menopausal state: Secondary | ICD-10-CM | POA: Diagnosis not present

## 2024-02-21 DIAGNOSIS — E2839 Other primary ovarian failure: Secondary | ICD-10-CM | POA: Insufficient documentation

## 2024-02-21 DIAGNOSIS — M8589 Other specified disorders of bone density and structure, multiple sites: Secondary | ICD-10-CM | POA: Diagnosis not present

## 2024-10-03 ENCOUNTER — Ambulatory Visit

## 2024-10-06 ENCOUNTER — Encounter: Admitting: Family Medicine
